# Patient Record
Sex: Female | Born: 1985 | Race: White | Hispanic: No | Marital: Married | Smoking: Never smoker
Health system: Southern US, Community
[De-identification: ages and names within clinical notes are randomized; demographics above are authoritative.]

## PROBLEM LIST (undated history)

## (undated) DIAGNOSIS — E663 Overweight: Secondary | ICD-10-CM

## (undated) DIAGNOSIS — T7840XA Allergy, unspecified, initial encounter: Secondary | ICD-10-CM

## (undated) DIAGNOSIS — L301 Dyshidrosis [pompholyx]: Secondary | ICD-10-CM

## (undated) DIAGNOSIS — Z789 Other specified health status: Secondary | ICD-10-CM

## (undated) HISTORY — DX: Overweight: E66.3

## (undated) HISTORY — DX: Dyshidrosis (pompholyx): L30.1

## (undated) HISTORY — PX: NO PAST SURGERIES: SHX2092

## (undated) HISTORY — DX: Allergy, unspecified, initial encounter: T78.40XA

## (undated) HISTORY — PX: WISDOM TOOTH EXTRACTION: SHX21

---

## 2018-07-23 DIAGNOSIS — U071 COVID-19: Secondary | ICD-10-CM

## 2018-07-23 HISTORY — DX: COVID-19: U07.1

## 2018-12-04 ENCOUNTER — Ambulatory Visit (INDEPENDENT_AMBULATORY_CARE_PROVIDER_SITE_OTHER): Payer: BC Managed Care – PPO | Admitting: Physician Assistant

## 2018-12-04 ENCOUNTER — Other Ambulatory Visit (HOSPITAL_COMMUNITY)
Admission: RE | Admit: 2018-12-04 | Discharge: 2018-12-04 | Disposition: A | Discharge status: Home / Self Care | Payer: BC Managed Care – PPO | Source: Ambulatory Visit | Attending: Physician Assistant | Admitting: Physician Assistant

## 2018-12-04 ENCOUNTER — Encounter: Payer: Self-pay | Admitting: Physician Assistant

## 2018-12-04 VITALS — BP 116/79 | HR 79 | Temp 98.0°F | Ht 65.0 in | Wt 181.0 lb

## 2018-12-04 DIAGNOSIS — Z1329 Encounter for screening for other suspected endocrine disorder: Secondary | ICD-10-CM | POA: Diagnosis not present

## 2018-12-04 DIAGNOSIS — Z131 Encounter for screening for diabetes mellitus: Secondary | ICD-10-CM

## 2018-12-04 DIAGNOSIS — Z3169 Encounter for other general counseling and advice on procreation: Secondary | ICD-10-CM | POA: Diagnosis not present

## 2018-12-04 DIAGNOSIS — Z0000 Encounter for general adult medical examination without abnormal findings: Secondary | ICD-10-CM | POA: Diagnosis not present

## 2018-12-04 DIAGNOSIS — Z124 Encounter for screening for malignant neoplasm of cervix: Secondary | ICD-10-CM | POA: Insufficient documentation

## 2018-12-04 DIAGNOSIS — Z130 Encounter for screening for diseases of the blood and blood-forming organs and certain disorders involving the immune mechanism: Secondary | ICD-10-CM

## 2018-12-04 DIAGNOSIS — Z13220 Encounter for screening for lipoid disorders: Secondary | ICD-10-CM

## 2018-12-04 DIAGNOSIS — L301 Dyshidrosis [pompholyx]: Secondary | ICD-10-CM

## 2018-12-04 NOTE — Patient Instructions (Signed)
Preparing for Pregnancy  If you are considering becoming pregnant, make an appointment to see your regular health care provider to learn how to prepare for a safe and healthy pregnancy (preconception care). During a preconception care visit, your health care provider will:  · Do a complete physical exam, including a Pap test.  · Take a complete medical history.  · Give you information, answer your questions, and help you resolve problems.  Preconception checklist  Medical history  · Tell your health care provider about any current or past medical conditions. Your pregnancy or your ability to become pregnant may be affected by chronic conditions, such as diabetes, chronic hypertension, and thyroid problems.  · Include your family's medical history as well as your partner's medical history.  · Tell your health care provider about any history of STIs (sexually transmitted infections). These can affect your pregnancy. In some cases, they can be passed to your baby. Discuss any concerns that you have about STIs.  · If indicated, discuss the benefits of genetic testing. This testing will show whether there are any genetic conditions that may be passed from you or your partner to your baby.  · Tell your health care provider about:  ? Any problems you have had with conception or pregnancy.  ? Any medicines you take. These include vitamins, herbal supplements, and over-the-counter medicines.  ? Your history of immunizations. Discuss any vaccinations that you may need.  Diet  · Ask your health care provider what to include in a healthy diet that has a balance of nutrients. This is especially important when you are pregnant or preparing to become pregnant.  · Ask your health care provider to help you reach a healthy weight before pregnancy.  ? If you are overweight, you may be at higher risk for certain complications, such as high blood pressure, diabetes, and preterm birth.  ? If you are underweight, you are more likely to  have a baby who has a low birth weight.  Lifestyle, work, and home  · Let your health care provider know:  ? About any lifestyle habits that you have, such as alcohol use, drug use, or smoking.  ? About recreational activities that may put you at risk during pregnancy, such as downhill skiing and certain exercise programs.  ? Tell your health care provider about any international travel, especially any travel to places with an active Zika virus outbreak.  ? About harmful substances that you may be exposed to at work or at home. These include chemicals, pesticides, radiation, or even litter boxes.  ? If you do not feel safe at home.  Mental health  · Tell your health care provider about:  ? Any history of mental health conditions, including feelings of depression, sadness, or anxiety.  ? Any medicines that you take for a mental health condition. These include herbs and supplements.  Home instructions to prepare for pregnancy  Lifestyle    · Eat a balanced diet. This includes fresh fruits and vegetables, whole grains, lean meats, low-fat dairy products, healthy fats, and foods that are high in fiber. Ask to meet with a nutritionist or registered dietitian for assistance with meal planning and goals.  · Get regular exercise. Try to be active for at least 30 minutes a day on most days of the week. Ask your health care provider which activities are safe during pregnancy.  · Do not use any products that contain nicotine or tobacco, such as cigarettes and e-cigarettes. If you need   help quitting, ask your health care provider.  · Do not drink alcohol.  · Do not take illegal drugs.  · Maintain a healthy weight. Ask your health care provider what weight range is right for you.  General instructions  · Keep an accurate record of your menstrual periods. This makes it easier for your health care provider to determine your baby's due date.  · Begin taking prenatal vitamins and folic acid supplements daily as directed by your  health care provider.  · Manage any chronic conditions, such as high blood pressure and diabetes, as told by your health care provider. This is important.  How do I know that I am pregnant?  You may be pregnant if you have been sexually active and you miss your period. Symptoms of early pregnancy include:  · Mild cramping.  · Very light vaginal bleeding (spotting).  · Feeling unusually tired.  · Nausea and vomiting (morning sickness).  If you have any of these symptoms and you suspect that you might be pregnant, you can take a home pregnancy test. These tests check for a hormone in your urine (human chorionic gonadotropin, or hCG). A woman's body begins to make this hormone during early pregnancy. These tests are very accurate. Wait until at least the first day after you miss your period to take one. If the test shows that you are pregnant (you get a positive result), call your health care provider to make an appointment for prenatal care.  What should I do if I become pregnant?         · Make an appointment with your health care provider as soon as you suspect you are pregnant.  · Do not use any products that contain nicotine, such as cigarettes, chewing tobacco, and e-cigarettes. If you need help quitting, ask your health care provider.  · Do not drink alcoholic beverages. Alcohol is related to a number of birth defects.  · Avoid toxic odors and chemicals.  · You may continue to have sexual intercourse if it does not cause pain or other problems, such as vaginal bleeding.  This information is not intended to replace advice given to you by your health care provider. Make sure you discuss any questions you have with your health care provider.  Document Released: 06/21/2008 Document Revised: 07/11/2017 Document Reviewed: 01/29/2016  Elsevier Interactive Patient Education © 2019 [employer]

## 2018-12-04 NOTE — Progress Notes (Signed)
HPI:                                                                Shelia Delgado is a 33 y.o. female who presents to [facility] Primary [facility] Medicine today to establish care  Current Concerns include: annual physical  Planning for pregnancy. Will complete current pillpack and begin trying at the end of this month. Currently taking prenatal  GYN/Sexual Health  Obstetrics: G0P0  Menstrual status: OCP  LMP: 11/10/18  Menses:  Last pap smear: 2017  History of abnormal pap smears: never  Sexually active: yes, 1 female partner (husband)  Current contraception: OCP  History of STI: never  Depression screen PHQ 2/9 12/04/2018  Decreased Interest 0  Down, Depressed, Hopeless 0  PHQ - 2 Score 0    Health Maintenance Health Maintenance  Topic Date Due  . HIV Screening  04/02/2002  . TETANUS/TDAP  04/02/2006  . PAP SMEAR-Modifier  04/02/2008  . INFLUENZA VACCINE  02/21/2019    Past Medical History:  Diagnosis Date  . Allergy   . Dyshidrotic dermatitis   . Overweight    Past Surgical History:  Procedure Laterality Date  . WISDOM TOOTH EXTRACTION     Social History   Tobacco Use  . Smoking status: Never Smoker  . Smokeless tobacco: Never Used  Substance Use Topics  . Alcohol use: Yes    Alcohol/week: 14.0 standard drinks    Types: 14 Standard drinks or equivalent per week   family history includes Atrial fibrillation in her father; Diabetes in her maternal grandfather; Heart attack in her paternal grandfather and paternal uncle; Hypertension in her father and mother; Skin cancer in her mother.  ROS: negative except as noted in the HPI  Medications: Current Outpatient Medications  Medication Sig Dispense Refill  . NORETHINDRONE ACET-ETHINYL EST PO Take by mouth.    . Prenatal w/o A Vit-Fe Fum-FA (PRENATA PO) Take by mouth.     No current facility-administered medications for this visit.    Allergies  Allergen Reactions  .  Amoxicillin Rash    Mild rash after taking for 1 week       Objective:  BP 116/79   Pulse 79   Temp 98 F (36.7 C) (Oral)   Ht  (1.651 m)   Wt 181 lb (82.1 kg)   LMP 11/10/2018   BMI 30.12 kg/m  General Appearance:  Alert, cooperative, no distress, appropriate for age, overweight female                            Head:  Normocephalic, without obvious abnormality                             Eyes:  PERRL, EOM's intact, conjunctiva and cornea clear                             Ears:  TM pearly gray color and semitransparent, external ear canals normal, both ears  Nose:  Nares symmetrical, mucosa pink                          Throat:  Lips, tongue, and mucosa are moist, pink, and intact; oropharynx clear, uvula midline; good dentition                             Neck:  Supple; symmetrical, trachea midline, no adenopathy; thyroid: no enlargement, symmetric, no tenderness/mass/nodules                             Back:  Symmetrical, no curvature, ROM normal               Chest/Breast:  deferred                           Lungs:  Clear to auscultation bilaterally, respirations unlabored                             Heart:  normal rate & regular rhythm, S1 and S2 normal, no murmurs, rubs, or gallops                     Abdomen:  Soft, non-tender, no mass or organomegaly              Genitourinary:  vulva without rashes or lesions, normal introitus and urethral meatus, vaginal mucosa without erythema, normal discharge, cervix slightly friable without lesions,          Musculoskeletal:  Tone and strength strong and symmetrical, all extremities; no joint pain or edema, normal gait and station                                   Lymphatic:  No adenopathy             Skin/Hair/Nails:  Skin warm, dry and intact, no rashes or abnormal dyspigmentation on limited exam                   Neurologic:  Alert and oriented x3, no cranial nerve deficits, DTR's intact, sensation  grossly intact, normal gait and station, no tremor Psych: well-groomed, cooperative, good eye contact, euthymic mood, affect mood-congruent, speech is articulate, and thought processes clear and goal-directed   A chaperone was present for the GU portion of the exam, Olivia MackieEvonia Henry, RMA.   No results found for this or any previous visit (from the past 72 hour(s)). No results found.    Assessment and Plan: 33 y.o. female with   .Jill Sidelison was seen today for establish care.  Diagnoses and all orders for this visit:  Encounter for Pap smear of cervix with HPV DNA cotesting -     Cytology - PAP  Encounter for annual physical exam -     CBC -     COMPLETE METABOLIC PANEL WITH GFR -     Lipid Panel w/reflex Direct LDL -     TSH + free T4  Encounter for preconception consultation -     CBC -     COMPLETE METABOLIC PANEL WITH GFR -     Lipid Panel w/reflex Direct LDL -     TSH + free T4 -  Ambulatory referral to Obstetrics / Gynecology  Screening for thyroid disorder -     TSH + free T4  Screening for blood disease -     CBC -     COMPLETE METABOLIC PANEL WITH GFR  Screening for diabetes mellitus -     COMPLETE METABOLIC PANEL WITH GFR  Screening for lipid disorders -     Lipid Panel w/reflex Direct LDL  Dyshidrotic dermatitis   - Personally reviewed PMH, PSH, PFH, medications, allergies, HM - Age-appropriate cancer screening: Pap smear pending - Tdap UTD - PHQ2 negative - Routine labs pending - Continue prenatal - Establish care with OB/GYN at 10 weekss   Patient education and anticipatory guidance given Patient agrees with treatment plan Follow-up based on Pap results or sooner as needed  Levonne Hubert PA-C

## 2018-12-05 LAB — COMPLETE METABOLIC PANEL WITH GFR
AG Ratio: 1.4 (calc) (ref 1.0–2.5)
ALT: 16 U/L (ref 6–29)
AST: 17 U/L (ref 10–30)
Albumin: 4.1 g/dL (ref 3.6–5.1)
Alkaline phosphatase (APISO): 54 U/L (ref 31–125)
BUN: 12 mg/dL (ref 7–25)
CO2: 23 mmol/L (ref 20–32)
Calcium: 9.1 mg/dL (ref 8.6–10.2)
Chloride: 105 mmol/L (ref 98–110)
Creat: 0.76 mg/dL (ref 0.50–1.10)
GFR, Est African American: 121 mL/min/{1.73_m2} (ref >=60)
GFR, Est Non African American: 105 mL/min/{1.73_m2} (ref >=60)
Globulin: 3 g/dL (calc) (ref 1.9–3.7)
Glucose, Bld: 79 mg/dL (ref 65–99)
Potassium: 4.2 mmol/L (ref 3.5–5.3)
Sodium: 138 mmol/L (ref 135–146)
Total Bilirubin: 0.4 mg/dL (ref 0.2–1.2)
Total Protein: 7.1 g/dL (ref 6.1–8.1)

## 2018-12-05 LAB — LIPID PANEL W/REFLEX DIRECT LDL
Cholesterol: 191 mg/dL (ref <200)
HDL: 72 mg/dL (ref >=50)
LDL Cholesterol (Calc): 90 mg/dL (calc)
Non-HDL Cholesterol (Calc): 119 mg/dL (calc) (ref <130)
Total CHOL/HDL Ratio: 2.7 (calc) (ref <5.0)
Triglycerides: 193 mg/dL — ABNORMAL HIGH (ref <150)

## 2018-12-05 LAB — CBC
HCT: 41 % (ref 35.0–45.0)
Hemoglobin: 13.7 g/dL (ref 11.7–15.5)
MCH: 29.8 pg (ref 27.0–33.0)
MCHC: 33.4 g/dL (ref 32.0–36.0)
MCV: 89.1 fL (ref 80.0–100.0)
MPV: 10.9 fL (ref 7.5–12.5)
Platelets: 356 10*3/uL (ref 140–400)
RBC: 4.6 10*6/uL (ref 3.80–5.10)
RDW: 13.1 % (ref 11.0–15.0)
WBC: 11 10*3/uL — ABNORMAL HIGH (ref 3.8–10.8)

## 2018-12-05 LAB — CYTOLOGY - PAP
Diagnosis: NEGATIVE
HPV: NOT DETECTED

## 2018-12-05 LAB — TSH+FREE T4: TSH W/REFLEX TO FT4: 3.36 mIU/L

## 2019-01-26 ENCOUNTER — Ambulatory Visit (INDEPENDENT_AMBULATORY_CARE_PROVIDER_SITE_OTHER): Payer: BC Managed Care – PPO | Admitting: Obstetrics & Gynecology

## 2019-01-26 ENCOUNTER — Other Ambulatory Visit: Payer: Self-pay

## 2019-01-26 ENCOUNTER — Encounter: Payer: Self-pay | Admitting: Obstetrics & Gynecology

## 2019-01-26 VITALS — BP 133/79 | HR 90 | Ht 64.0 in | Wt 194.0 lb

## 2019-01-26 DIAGNOSIS — Z6833 Body mass index (BMI) 33.0-33.9, adult: Secondary | ICD-10-CM

## 2019-01-26 DIAGNOSIS — Z6838 Body mass index (BMI) 38.0-38.9, adult: Secondary | ICD-10-CM | POA: Insufficient documentation

## 2019-01-26 DIAGNOSIS — Z3169 Encounter for other general counseling and advice on procreation: Secondary | ICD-10-CM | POA: Diagnosis not present

## 2019-01-26 NOTE — Progress Notes (Signed)
   Subjective:    Patient ID: Shelia Delgado, female    DOB: 05/30/1986, 33 y.o.   MRN: [id number]  HPI  33 year old female was referred for preconceptual counseling.  Patient stopped birth control after being on it since age 64.  She did have a menstrual cycle approximately 28 days after stopping her birth control.  Her husband to started PA school and they are trying but not with marked intention.  She is on prenatal vitamins.  She is not interested in preconceptual genetic testing at this time.  Review of Systems  Constitutional: Negative.   Respiratory: Negative.   Cardiovascular: Negative.   Gastrointestinal: Negative.   Genitourinary: Negative.  Negative for menstrual problem.       Objective:   Physical Exam Vitals signs reviewed.  Constitutional:      General: She is not in acute distress.    Appearance: She is well-developed.  HENT:     Head: Normocephalic and atraumatic.  Eyes:     Conjunctiva/sclera: Conjunctivae normal.  Cardiovascular:     Rate and Rhythm: Normal rate.  Pulmonary:     Effort: Pulmonary effort is normal.  Skin:    General: Skin is warm and dry.  Neurological:     Mental Status: She is alert and oriented to person, place, and time.    Assessment & Plan:  33 yo female referred from preconceptual counseling.  1.  Continue prenatal vitamins 2.  Recommended normal BMI prior to conception.  Pt has weight loss plan and is exercising more. 3.  If not pregnant after 6 months of timed intercourse, pt should return to office.    20 mins spent face to face with greater than 50% counseling.

## 2019-02-13 ENCOUNTER — Encounter: Payer: Self-pay | Admitting: Physician Assistant

## 2019-05-22 ENCOUNTER — Encounter: Payer: Self-pay | Admitting: Osteopathic Medicine

## 2019-05-22 ENCOUNTER — Other Ambulatory Visit: Payer: Self-pay

## 2019-05-22 ENCOUNTER — Ambulatory Visit (INDEPENDENT_AMBULATORY_CARE_PROVIDER_SITE_OTHER): Payer: 59 | Admitting: Osteopathic Medicine

## 2019-05-22 VITALS — BP 120/79 | HR 71 | Temp 97.8°F | Wt 203.0 lb

## 2019-05-22 DIAGNOSIS — R635 Abnormal weight gain: Secondary | ICD-10-CM | POA: Diagnosis not present

## 2019-05-22 NOTE — Progress Notes (Signed)
HPI: Shelia Delgado is a 33 y.o. female who  has a past medical history of Allergy, Dyshidrotic dermatitis, and Overweight.  she presents to [facility] today, 05/22/19,  for chief complaint of:  Weight   Gain of about 50 lbs over 3 years, has not really changed diet/exercise other than getting off of weight watchers after her wedding.  Is concerned about some stretch marks  Wt Readings from Last 3 Encounters:  05/22/19 203 lb 0.6 oz (92.1 kg)  01/26/19 194 lb (88 kg)  12/04/18 181 lb (82.1 kg)   BP Readings from Last 3 Encounters:  05/22/19 120/79  01/26/19 133/79  12/04/18 116/79      At today's visit 05/22/19 ... PMH, PSH, FH reviewed and updated as needed.  Current medication list and allergy/intolerance hx reviewed and updated as needed. (See remainder of HPI, ROS, Phys Exam below)   No results found.  No results found for this or any previous visit (from the past 72 hour(s)).        ASSESSMENT/PLAN: The encounter diagnosis was Abnormal weight gain.   I do not have a strong suspicion for Cushing's, given normal blood pressure, no facial plethora, proximal myalgia.  There are some stretch marks but none appear to be greater than 1 cm wide, they are a little on the red side but no bright red or dark purple.  Normal periods, no history of infertility though has never been pregnant.  I do not have a strong suspicion for female hormones being out of whack or hyperandrogenism.  If continued weight gain/infertility would strongly consider referral to reproductive endocrinology  Orders Placed This Encounter  Procedures  . COMPLETE METABOLIC PANEL WITH GFR  . TSH  . T4, free  . Hemoglobin A1c  . Cortisol-am, blood     No orders of the defined types were placed in this encounter.   Patient Instructions  Lab test for the usual suspects, particularly thyroid disorder, diabetes screening. I don't have a strong suspicion for  Cushing's, we can get a cortisol level just to check though! I don't have a strong suspicion for female hormones being out of whack, since periods are normal. I think ok to go ahead and try for pregnancy and can continue weight loss through pregnancy as long as no malnutrition / over-exercise!        Follow-up plan: Return if symptoms worsen or fail to improve.                                                 ################################################# ################################################# ################################################# #################################################    Current Meds  Medication Sig  . Prenatal w/o A Vit-Fe Fum-FA (PRENATA PO) Take by mouth.    Allergies  Allergen Reactions  . Amoxicillin Rash    Mild rash after taking for 1 week       Review of Systems:  Constitutional: No recent illness  HEENT: No  headache, no vision change  Cardiac: No  chest pain, No  pressure, No palpitations  Respiratory:  No  shortness of breath. No  Cough  Gastrointestinal: No  abdominal pain  Musculoskeletal: No new myalgia/arthralgia  Skin: No  Rash, +stretch marks on abdomen   Hem/Onc: No  easy bruising/bleeding, No  abnormal lumps/bumps  Neurologic: No  weakness, No  Dizziness  Psychiatric: No  concerns with  depression, No  concerns with anxiety  Exam:  BP 120/79 (BP Location: Left Arm, Patient Position: Sitting, Cuff Size: Normal)   Pulse 71   Temp 97.8 F (36.6 C) (Oral)   Wt 203 lb 0.6 oz (92.1 kg)   BMI 34.85 kg/m   Constitutional: VS see above. General Appearance: alert, well-developed, well-nourished, NAD  Eyes: Normal lids and conjunctive, non-icteric sclera  Ears, Nose, Mouth, Throat: MMM, Normal external inspection ears/nares/mouth/lips/gums.  Neck: No masses, trachea midline.   Respiratory: Normal respiratory effort. no wheeze, no rhonchi, no  rales  Cardiovascular: S1/S2 normal, no murmur, no rub/gallop auscultated. RRR.   Musculoskeletal: Gait normal. Symmetric and independent movement of all extremities  Neurological: Normal balance/coordination. No tremor.  Skin: warm, dry, intact.   Psychiatric: Normal judgment/insight. Normal mood and affect. Oriented x3.       Visit summary with medication list and pertinent instructions was printed for patient to review, patient was advised to alert [place] if any updates are needed. All questions at time of visit were answered - patient instructed to contact office with any additional concerns. ER/RTC precautions were reviewed with the patient and understanding verbalized.   Note: Total time spent 25 minutes, greater than 50% of the visit was spent face-to-face counseling and coordinating care for the following: The encounter diagnosis was Abnormal weight gain.Marland Kitchen  Please note: voice recognition software was used to produce this document, and typos may escape review. Please contact Dr. Sheppard Coil for any needed clarifications.    Follow up plan: Return if symptoms worsen or fail to improve.

## 2019-05-22 NOTE — Patient Instructions (Addendum)
Lab test for the usual suspects, particularly thyroid disorder, diabetes screening. I don't have a strong suspicion for Cushing's, we can get a cortisol level just to check though! I don't have a strong suspicion for female hormones being out of whack, since periods are normal. I think ok to go ahead and try for pregnancy and can continue weight loss through pregnancy as long as no malnutrition / over-exercise!

## 2019-05-25 LAB — COMPLETE METABOLIC PANEL WITH GFR
AG Ratio: 1.6 (calc) (ref 1.0–2.5)
ALT: 35 U/L — ABNORMAL HIGH (ref 6–29)
AST: 23 U/L (ref 10–30)
Albumin: 4.2 g/dL (ref 3.6–5.1)
Alkaline phosphatase (APISO): 84 U/L (ref 31–125)
BUN: 11 mg/dL (ref 7–25)
CO2: 23 mmol/L (ref 20–32)
Calcium: 9.3 mg/dL (ref 8.6–10.2)
Chloride: 106 mmol/L (ref 98–110)
Creat: 0.73 mg/dL (ref 0.50–1.10)
GFR, Est African American: 126 mL/min/{1.73_m2} (ref >=60)
GFR, Est Non African American: 109 mL/min/{1.73_m2} (ref >=60)
Globulin: 2.6 g/dL (calc) (ref 1.9–3.7)
Glucose, Bld: 84 mg/dL (ref 65–99)
Potassium: 4.2 mmol/L (ref 3.5–5.3)
Sodium: 138 mmol/L (ref 135–146)
Total Bilirubin: 0.4 mg/dL (ref 0.2–1.2)
Total Protein: 6.8 g/dL (ref 6.1–8.1)

## 2019-05-25 LAB — HEMOGLOBIN A1C
Hgb A1c MFr Bld: 5.1 % of total Hgb (ref <5.7)
Mean Plasma Glucose: 100 (calc)
eAG (mmol/L): 5.5 (calc)

## 2019-05-25 LAB — T4, FREE: Free T4: 0.8 ng/dL (ref 0.8–1.8)

## 2019-05-25 LAB — TSH: TSH: 3.1 mIU/L

## 2019-05-25 LAB — CORTISOL-AM, BLOOD: Cortisol - AM: 10.1 ug/dL

## 2019-08-04 ENCOUNTER — Ambulatory Visit (INDEPENDENT_AMBULATORY_CARE_PROVIDER_SITE_OTHER): Payer: 59 | Admitting: Osteopathic Medicine

## 2019-08-04 ENCOUNTER — Encounter: Payer: Self-pay | Admitting: Osteopathic Medicine

## 2019-08-04 ENCOUNTER — Other Ambulatory Visit: Payer: Self-pay

## 2019-08-04 VITALS — BP 135/91 | HR 90 | Temp 98.2°F | Wt 201.0 lb

## 2019-08-04 DIAGNOSIS — M2550 Pain in unspecified joint: Secondary | ICD-10-CM

## 2019-08-04 DIAGNOSIS — M791 Myalgia, unspecified site: Secondary | ICD-10-CM | POA: Diagnosis not present

## 2019-08-04 NOTE — Patient Instructions (Signed)
Will see what labs show!  See printed info for hip exercises Consider rheumatology referral if looking like RA Consider sports med consult here w/ Dr T is labs all normal

## 2019-08-04 NOTE — Progress Notes (Signed)
HPI: Shelia Delgado is a 34 y.o. female who  has a past medical history of Allergy, Dyshidrotic dermatitis, and Overweight.  she presents to [facility] today, 08/04/19,  for chief complaint of:  Weight follow-up Joint pain  08/04/19 today: joint pain for a few months, worse w/ exercise, worst in hips. Has done some stretching prior to working out. Hasn't really helped. Morning she notes some unsteadiness d/t ankle/foot pain. Associated w/ dry eyes in AM. Concerned about RA. Fasting today.   Last visit 05/22/2019: She had concern for Cushing's given weight gain of 2 pounds over the past 3 years, and stretch marks.  At that time, I let patient know I did not have a very strong suspicion for Cushing's given normal blood pressure, no facial plethora, no proximal myalgia, and stretch marks are present but are not really fitting the typical appearance of Cushing's in terms of with which was less than 1 cm, and color which was normal rather than bright red or dark purple. If continued weight gain/infertility would strongly consider referral to reproductive endocrinology   Wt Readings from Last 3 Encounters:  08/04/19 201 lb (91.2 kg)  05/22/19 203 lb 0.6 oz (92.1 kg)  01/26/19 194 lb (88 kg)        At today's visit 08/04/19 ... PMH, PSH, FH reviewed and updated as needed.  Current medication list and allergy/intolerance hx reviewed and updated as needed. (See remainder of HPI, ROS, Phys Exam below)   No results found.  No results found for this or any previous visit (from the past 72 hour(s)).  Results reviewed from labs at last visit 05/22/2019: Normal a.m. cortisol, TSH, A1c, CMP with the exception of minimally above normal liver enzymes ALT at 35 unlikely of clinical significance      ASSESSMENT/PLAN: The primary encounter diagnosis was Myalgia. A diagnosis of Arthralgia, unspecified joint was also pertinent to this visit.   Some hip  tenderness w/ resisted hip extension and w/ FADIR, no other phsyical exam findings   Orders Placed This Encounter  Procedures  . CK  . High sensitivity CRP  . Rheumatoid factor  . Sedimentation rate  . ANA  . Sjogren's syndrome antibods(ssa + ssb)  . COMPLETE METABOLIC PANEL WITH GFR     No orders of the defined types were placed in this encounter.   Patient Instructions  Will see what labs show!  See printed info for hip exercises Consider rheumatology referral if looking like RA Consider sports med consult here w/ Dr T is labs all normal     Follow-up plan: Return for RECHECK PENDING RESULTS / IF WORSE OR CHANGE.                                                 ################################################# ################################################# ################################################# #################################################    Current Meds  Medication Sig  . Prenatal w/o A Vit-Fe Fum-FA (PRENATA PO) Take by mouth.    Allergies  Allergen Reactions  . Amoxicillin Rash    Mild rash after taking for 1 week       Review of Systems:  Constitutional: No recent illness  HEENT: Dry eye, no dry mouth   Cardiac: No  chest pain, No  pressure, No palpitations  Respiratory:  No  shortness of breath. No  Cough  Gastrointestinal: No  abdominal pain,  no change on bowel habits  Musculoskeletal: +new myalgia/arthralgia  Skin: No  Rash   Exam:  BP (!) 135/91 (BP Location: Left Arm, Patient Position: Sitting, Cuff Size: Normal)   Pulse 90   Temp 98.2 F (36.8 C) (Oral)   Wt 201 lb (91.2 kg)   BMI 34.50 kg/m   Constitutional: VS see above. General Appearance: alert, well-developed, well-nourished, NAD  Respiratory: Normal respiratory effort.  Musculoskeletal: Gait normal. Symmetric and independent movement of all extremities, see A/P  Neurological: Normal balance/coordination. No  tremor.  Psychiatric: Normal judgment/insight. Normal mood and affect. Oriented x3.       Visit summary with medication list and pertinent instructions was printed for patient to review, patient was advised to alert [place] if any updates are needed. All questions at time of visit were answered - patient instructed to contact office with any additional concerns. ER/RTC precautions were reviewed with the patient and understanding verbalized.    Please note: voice recognition software was used to produce this document, and typos may escape review. Please contact Dr. Sheppard Coil for any needed clarifications.    Follow up plan: Return for RECHECK PENDING RESULTS / IF WORSE OR CHANGE.

## 2019-08-05 ENCOUNTER — Encounter: Payer: Self-pay | Admitting: Osteopathic Medicine

## 2019-08-06 LAB — ANA: Anti Nuclear Antibody (ANA): POSITIVE — AB

## 2019-08-06 LAB — CK: Total CK: 165 U/L — ABNORMAL HIGH (ref 29–143)

## 2019-08-06 LAB — COMPLETE METABOLIC PANEL WITH GFR
AG Ratio: 1.6 (calc) (ref 1.0–2.5)
ALT: 51 U/L — ABNORMAL HIGH (ref 6–29)
AST: 27 U/L (ref 10–30)
Albumin: 4.4 g/dL (ref 3.6–5.1)
Alkaline phosphatase (APISO): 80 U/L (ref 31–125)
BUN: 11 mg/dL (ref 7–25)
CO2: 24 mmol/L (ref 20–32)
Calcium: 9.7 mg/dL (ref 8.6–10.2)
Chloride: 105 mmol/L (ref 98–110)
Creat: 0.81 mg/dL (ref 0.50–1.10)
GFR, Est African American: 111 mL/min/{1.73_m2} (ref >=60)
GFR, Est Non African American: 96 mL/min/{1.73_m2} (ref >=60)
Globulin: 2.7 g/dL (calc) (ref 1.9–3.7)
Glucose, Bld: 81 mg/dL (ref 65–99)
Potassium: 4.1 mmol/L (ref 3.5–5.3)
Sodium: 138 mmol/L (ref 135–146)
Total Bilirubin: 0.6 mg/dL (ref 0.2–1.2)
Total Protein: 7.1 g/dL (ref 6.1–8.1)

## 2019-08-06 LAB — HIGH SENSITIVITY CRP: hs-CRP: 1.9 mg/L

## 2019-08-06 LAB — RHEUMATOID FACTOR: Rheumatoid fact SerPl-aCnc: <14 IU/mL (ref <14)

## 2019-08-06 LAB — ANTI-NUCLEAR AB-TITER (ANA TITER): ANA Titer 1: 1:40 {titer} — ABNORMAL HIGH

## 2019-08-06 LAB — SJOGREN'S SYNDROME ANTIBODS(SSA + SSB)
SSA (Ro) (ENA) Antibody, IgG: <1 AI
SSB (La) (ENA) Antibody, IgG: <1 AI

## 2019-08-06 LAB — SEDIMENTATION RATE: Sed Rate: 6 mm/h (ref 0–20)

## 2019-08-20 ENCOUNTER — Encounter: Payer: Self-pay | Admitting: Osteopathic Medicine

## 2019-08-20 DIAGNOSIS — R768 Other specified abnormal immunological findings in serum: Secondary | ICD-10-CM

## 2019-09-16 NOTE — Progress Notes (Signed)
Office Visit Note  Patient: Shelia Delgado             Date of Birth: 08-23-1985           MRN: [id number]             PCP: Emeterio Reeve, DO Referring: Emeterio Reeve, DO Visit Date: 09/18/2019 Occupation: '@GUAROCC' @  Subjective:  Positive ANA and hip pain.   History of Present Illness: Shelia Delgado is a 34 y.o. female seen in consultation per request of her PCP.  According to patient she she has been doing strength training exercises for the last few months.  She started exercising in November 2020 and gradually she started having pain in her bilateral hips.  She states the pain got worse over time to the point she is having difficulty standing and walking.  The pain gets worse when she is exercising.  Now she is doing only upper body exercises.  She states that she cleaned her house yesterday and the pain got worse.  None of the other joints are painful.  She has some stiffness in her feet that she has bilateral bunions.  There is no history of oral ulcers, nasal ulcers, malar rash, photosensitivity, Raynaud's phenomenon or joint swelling.  There is no family history of autoimmune disease.  There is history of osteoarthritis in her mother.  Activities of Daily Living:  Patient reports morning stiffness for 0 minutes.   Patient Denies nocturnal pain.  Difficulty dressing/grooming: Denies Difficulty climbing stairs: Reports Difficulty getting out of chair: Denies Difficulty using hands for taps, buttons, cutlery, and/or writing: Denies  Review of Systems  Constitutional: Negative for fatigue, night sweats, weight gain and weight loss.  HENT: Negative for mouth sores, trouble swallowing, trouble swallowing, mouth dryness and nose dryness.   Eyes: Positive for dryness. Negative for pain, redness and visual disturbance.  Respiratory: Negative for cough, shortness of breath and difficulty breathing.   Cardiovascular: Negative for chest pain, palpitations, hypertension,  irregular heartbeat and swelling in legs/feet.  Gastrointestinal: Negative for blood in stool, constipation and diarrhea.  Endocrine: Negative for increased urination.  Genitourinary: Negative for difficulty urinating, painful urination and vaginal dryness.  Musculoskeletal: Positive for arthralgias and joint pain. Negative for joint swelling, myalgias, muscle weakness, morning stiffness, muscle tenderness and myalgias.  Skin: Positive for rash. Negative for color change, hair loss, skin tightness, ulcers and sensitivity to sunlight.  Allergic/Immunologic: Negative for susceptible to infections.  Neurological: Positive for headaches. Negative for dizziness, numbness, memory loss, night sweats and weakness.  Hematological: Negative for bruising/bleeding tendency and swollen glands.  Psychiatric/Behavioral: Negative for depressed mood, confusion and sleep disturbance. The patient is not nervous/anxious.     PMFS History:  Patient Active Problem List   Diagnosis Date Noted  . BMI 33.0-33.9,adult 01/26/2019  . Encounter for preconception consultation 01/26/2019  . Dyshidrotic dermatitis     Past Medical History:  Diagnosis Date  . Allergy   . Dyshidrotic dermatitis   . Overweight     Family History  Problem Relation Age of Onset  . Hypertension Mother   . Skin cancer Mother   . Hypertension Father   . Atrial fibrillation Father   . Diabetes Maternal Grandfather   . Heart attack Paternal Grandfather   . Heart attack Paternal Uncle   . Healthy Brother    Past Surgical History:  Procedure Laterality Date  . WISDOM TOOTH EXTRACTION     Social History   Social History Narrative  . Not on  file   Immunization History  Administered Date(s) Administered  . Hepatitis B 04/06/1997, 05/16/1999, 10/19/1999  . Hpv 11/05/2005, 03/18/2007, 07/22/2007  . MMR 11/19/1988, 04/06/1997  . PPD Test 06/21/2018  . Tdap 11/05/2005, 05/21/2017  . Varicella 05/16/1999, 07/28/2008      Objective: Vital Signs: BP 124/82 (BP Location: Right Arm, Patient Position: Sitting, Cuff Size: Normal)   Pulse 71   Resp 14   Ht '5\' 4"'  (1.626 m)   Wt 201 lb (91.2 kg)   BMI 34.50 kg/m    Physical Exam Vitals and nursing note reviewed.  Constitutional:      Appearance: She is well-developed.  HENT:     Head: Normocephalic and atraumatic.  Eyes:     Conjunctiva/sclera: Conjunctivae normal.  Cardiovascular:     Rate and Rhythm: Normal rate and regular rhythm.     Heart sounds: Normal heart sounds.  Pulmonary:     Effort: Pulmonary effort is normal.     Breath sounds: Normal breath sounds.  Abdominal:     General: Bowel sounds are normal.     Palpations: Abdomen is soft.  Musculoskeletal:     Cervical back: Normal range of motion.  Lymphadenopathy:     Cervical: No cervical adenopathy.  Skin:    General: Skin is warm and dry.     Capillary Refill: Capillary refill takes less than 2 seconds.  Neurological:     Mental Status: She is alert and oriented to person, place, and time.  Psychiatric:        Behavior: Behavior normal.      Musculoskeletal Exam: C-spine thoracic and lumbar spine were in good range of motion.  She had no SI joint tenderness.  Shoulder joints, elbow joints, wrist joints, MCPs and PIPs with good range of motion with no synovitis.  Hip joints, knee joints, ankles, MTPs and PIPs with good range of motion with no synovitis.  She has bilateral hallux rigidus.  She had tenderness on palpation over bilateral ischial bursa and gluteal region.  CDAI Exam: CDAI Score: -- Patient Global: --; Provider Global: -- Swollen: --; Tender: -- Joint Exam 09/18/2019   No joint exam has been documented for this visit   There is currently no information documented on the homunculus. Go to the Rheumatology activity and complete the homunculus joint exam.  Investigation: No additional findings.  Imaging: No results found.  Recent Labs: Lab Results  Component  Value Date   WBC 11.0 (H) 12/04/2018   HGB 13.7 12/04/2018   PLT 356 12/04/2018   NA 138 08/04/2019   K 4.1 08/04/2019   CL 105 08/04/2019   CO2 24 08/04/2019   GLUCOSE 81 08/04/2019   BUN 11 08/04/2019   CREATININE 0.81 08/04/2019   BILITOT 0.6 08/04/2019   AST 27 08/04/2019   ALT 51 (H) 08/04/2019   PROT 7.1 08/04/2019   CALCIUM 9.7 08/04/2019   GFRAA 111 08/04/2019    Speciality Comments: No specialty comments available.  Procedures:  No procedures performed Allergies: Amoxicillin   Assessment / Plan:     Visit Diagnoses: Positive ANA (antinuclear antibody) - 08/04/19: ANA 1:40 NS, Ro-, La-, CK 165, CRP 1.9, RF-, ESR 6.  The ANA titer is very low and not significant.  She has no clinical features of autoimmune disease.  Chronic gluteal pain-she started experiencing gluteal pain after doing strenuous strength training.  She has some tenderness on palpation of the muscles.  Ischial bursitis, unspecified laterality-she has tenderness over bilateral ischial bursa.  I will refer her to physical therapy.  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  She did not have any rash on examination today.  Orders: Orders Placed This Encounter  Procedures  . Ambulatory referral to Physical Therapy   No orders of the defined types were placed in this encounter.   Face-to-face time spent with patient was 45 minutes. Greater than 50% of time was spent in counseling and coordination of care.  Follow-Up Instructions: Return in about 2 months (around 11/16/2019) for Positive ANA, ischial bursitis.   Bo Merino, MD  Note - This record has been created using Editor, commissioning.  Chart creation errors have been sought, but may not always  have been located. Such creation errors do not reflect on  the standard of medical care.

## 2019-09-18 ENCOUNTER — Ambulatory Visit (INDEPENDENT_AMBULATORY_CARE_PROVIDER_SITE_OTHER): Payer: 59 | Admitting: Rheumatology

## 2019-09-18 ENCOUNTER — Encounter: Payer: Self-pay | Admitting: Rheumatology

## 2019-09-18 ENCOUNTER — Other Ambulatory Visit: Payer: Self-pay

## 2019-09-18 VITALS — BP 124/82 | HR 71 | Resp 14 | Ht 64.0 in | Wt 201.0 lb

## 2019-09-18 DIAGNOSIS — M7918 Myalgia, other site: Secondary | ICD-10-CM | POA: Diagnosis not present

## 2019-09-18 DIAGNOSIS — L301 Dyshidrosis [pompholyx]: Secondary | ICD-10-CM

## 2019-09-18 DIAGNOSIS — R768 Other specified abnormal immunological findings in serum: Secondary | ICD-10-CM | POA: Diagnosis not present

## 2019-09-18 DIAGNOSIS — M7070 Other bursitis of hip, unspecified hip: Secondary | ICD-10-CM | POA: Diagnosis not present

## 2019-09-18 DIAGNOSIS — G8929 Other chronic pain: Secondary | ICD-10-CM

## 2019-10-26 ENCOUNTER — Ambulatory Visit: Payer: Self-pay | Admitting: Rheumatology

## 2019-11-19 ENCOUNTER — Ambulatory Visit: Payer: Self-pay | Admitting: Rheumatology

## 2019-12-04 ENCOUNTER — Encounter: Payer: Self-pay | Admitting: Osteopathic Medicine

## 2019-12-04 ENCOUNTER — Encounter: Payer: Self-pay | Admitting: Physician Assistant

## 2019-12-04 ENCOUNTER — Other Ambulatory Visit: Payer: Self-pay

## 2019-12-04 ENCOUNTER — Ambulatory Visit (INDEPENDENT_AMBULATORY_CARE_PROVIDER_SITE_OTHER): Payer: 59 | Admitting: Osteopathic Medicine

## 2019-12-04 VITALS — Wt 200.0 lb

## 2019-12-04 DIAGNOSIS — Z0000 Encounter for general adult medical examination without abnormal findings: Secondary | ICD-10-CM | POA: Diagnosis not present

## 2019-12-04 LAB — COMPLETE METABOLIC PANEL WITH GFR
AG Ratio: 1.7 (calc) (ref 1.0–2.5)
ALT: 52 U/L — ABNORMAL HIGH (ref 6–29)
AST: 20 U/L (ref 10–30)
Albumin: 4.4 g/dL (ref 3.6–5.1)
Alkaline phosphatase (APISO): 93 U/L (ref 31–125)
BUN: 11 mg/dL (ref 7–25)
CO2: 25 mmol/L (ref 20–32)
Calcium: 9.2 mg/dL (ref 8.6–10.2)
Chloride: 105 mmol/L (ref 98–110)
Creat: 0.75 mg/dL (ref 0.50–1.10)
GFR, Est African American: 122 mL/min/{1.73_m2} (ref >=60)
GFR, Est Non African American: 105 mL/min/{1.73_m2} (ref >=60)
Globulin: 2.6 g/dL (calc) (ref 1.9–3.7)
Glucose, Bld: 82 mg/dL (ref 65–99)
Potassium: 4 mmol/L (ref 3.5–5.3)
Sodium: 139 mmol/L (ref 135–146)
Total Bilirubin: 0.3 mg/dL (ref 0.2–1.2)
Total Protein: 7 g/dL (ref 6.1–8.1)

## 2019-12-04 LAB — CBC
HCT: 40.3 % (ref 35.0–45.0)
Hemoglobin: 13.5 g/dL (ref 11.7–15.5)
MCH: 30.5 pg (ref 27.0–33.0)
MCHC: 33.5 g/dL (ref 32.0–36.0)
MCV: 91 fL (ref 80.0–100.0)
MPV: 11 fL (ref 7.5–12.5)
Platelets: 291 10*3/uL (ref 140–400)
RBC: 4.43 10*6/uL (ref 3.80–5.10)
RDW: 12.5 % (ref 11.0–15.0)
WBC: 12.7 10*3/uL — ABNORMAL HIGH (ref 3.8–10.8)

## 2019-12-04 LAB — LIPID PANEL
Cholesterol: 170 mg/dL (ref <200)
HDL: 61 mg/dL (ref >=50)
LDL Cholesterol (Calc): 91 mg/dL (calc)
Non-HDL Cholesterol (Calc): 109 mg/dL (calc) (ref <130)
Total CHOL/HDL Ratio: 2.8 (calc) (ref <5.0)
Triglycerides: 86 mg/dL (ref <150)

## 2019-12-04 MED ORDER — PRENATA 29-1 MG PO CHEW: 1.0000 | CHEWABLE_TABLET | Freq: Every day | ORAL | 3 refills | Status: DC

## 2019-12-04 NOTE — Progress Notes (Signed)
Shelia Delgado is a 34 y.o. female who presents to  [facility] at [facility]  today, 12/04/19, seeking care for the following: . Annual physical - conducted virtually d/t recent coughing/COVID testing - I am disinclined to trust negative results in unvaccinated people, pt is ok to do virtual check-up.      ASSESSMENT & PLAN with other pertinent history/findings:  The encounter diagnosis was Annual physical exam.     Patient Instructions  General Preventive Care  Most recent routine screening labs: ordered today.   Everyone should have blood pressure checked once per year. Goal 130/80 or less.   Tobacco: don't! Alcohol: responsible moderation is ok for most adults - if you have concerns about your alcohol intake, please talk to me!   Exercise: as tolerated to reduce risk of cardiovascular disease and diabetes. Strength training will also prevent osteoporosis.   Mental health: if need fo mental health care (medicines, counseling, other), or concerns about moods, please let me know!   Sexual health: if need for STD testing, or if concerns with libido/pain problems, please let me know! If you need to discuss your birth control options, please let me know!   Advanced Directive: Living Will and/or Healthcare Power of Attorney recommended for all adults, regardless of age or health.  Vaccines  Flu vaccine: for almost everyone, every fall.   HPV vaccine: al done!  Shingles vaccine: after age 50.   Pneumonia vaccines: after age 65  Tetanus booster: every 10 years - due 2028  COVID vaccine: RECOMMENDED  Cancer screenings   Colon cancer screening: for everyone age 45-75.   Breast cancer screening: mammogram at age 40   Cervical cancer screening: Pap due 11/2023.   Lung cancer screening: not needed for non-smokers  Infection screenings  . HIV: recommended screening at least once age 18-65, more often as  needed. . Gonorrhea/Chlamydia: screening as needed . Hepatitis C: recommended once for everyone age 18-75 . TB: certain at-risk populations Other . Bone Density Test: recommended for women at age 65    Orders Placed This Encounter  Procedures  . CBC  . COMPLETE METABOLIC PANEL WITH GFR  . Lipid panel    Meds ordered this encounter  Medications  . Prenatal w/o A Vit-Fe Fum-FA (PRENATAL VITAMIN W/FE, FA) 29-1 MG CHEW    Sig: Chew 1 tablet by mouth daily.    Dispense:  90 tablet    Refill:  3     Immunization History  Administered Date(s) Administered  . Hepatitis B 04/06/1997, 05/16/1999, 10/19/1999  . Hpv 11/05/2005, 03/18/2007, 07/22/2007  . MMR 11/19/1988, 04/06/1997  . PPD Test 06/21/2018  . Tdap 11/05/2005, 05/21/2017  . Varicella 05/16/1999, 07/28/2008         Follow-up instructions: Return in about 1 year (around 12/03/2020) for ANNUAL (call week prior to visit for lab orders).                                         Wt 200 lb (90.7 kg)   LMP 11/30/2019   BMI 34.33 kg/m   Current Meds  Medication Sig  . [DISCONTINUED] Prenatal w/o A Vit-Fe Fum-FA (PRENATA PO) Take by mouth.    No results found for this or any previous visit (from the past 72 hour(s)).  No results found.  Depression screen PHQ 2/9 12/04/2019 05/22/2019 12/04/2018  Decreased Interest   0 0 0  Down, Depressed, Hopeless 0 0 0  PHQ - 2 Score 0 0 0  Altered sleeping 0 0 -  Tired, decreased energy 0 1 -  Change in appetite 0 0 -  Feeling bad or failure about yourself  0 1 -  Trouble concentrating 0 0 -  Moving slowly or fidgety/restless 0 0 -  Suicidal thoughts 0 0 -  PHQ-9 Score 0 2 -  Difficult doing work/chores - Not difficult at all -    GAD 7 : Generalized Anxiety Score 12/04/2019 05/22/2019  Nervous, Anxious, on Edge 0 0  Control/stop worrying 0 0  Worry too much - different things 0 0  Trouble relaxing 0 0  Restless 0 0  Easily annoyed  or irritable 0 0  Afraid - awful might happen 0 0  Total GAD 7 Score 0 0  Anxiety Difficulty - Not difficult at all      All questions at time of visit were answered - patient instructed to contact office with any additional concerns or updates.  ER/RTC precautions were reviewed with the patient.  Please note: voice recognition software was used to produce this document, and typos may escape review. Please contact Dr. Sheppard Coil for any needed clarifications.   Total encounter time: 30 minutes.

## 2019-12-04 NOTE — Patient Instructions (Signed)
General Preventive Care  Most recent routine screening labs: ordered today.   Everyone should have blood pressure checked once per year. Goal 130/80 or less.   Tobacco: don't! Alcohol: responsible moderation is ok for most adults - if you have concerns about your alcohol intake, please talk to me!   Exercise: as tolerated to reduce risk of cardiovascular disease and diabetes. Strength training will also prevent osteoporosis.   Mental health: if need fo mental health care (medicines, counseling, other), or concerns about moods, please let me know!   Sexual health: if need for STD testing, or if concerns with libido/pain problems, please let me know! If you need to discuss your birth control options, please let me know!   Advanced Directive: Living Will and/or Healthcare Power of Attorney recommended for all adults, regardless of age or health.  Vaccines  Flu vaccine: for almost everyone, every fall.   HPV vaccine: al done!  Shingles vaccine: after age 12.   Pneumonia vaccines: after age 76  Tetanus booster: every 10 years - due 2028  COVID vaccine: RECOMMENDED  Cancer screenings   Colon cancer screening: for everyone age 83-75.   Breast cancer screening: mammogram at age 58   Cervical cancer screening: Pap due 11/2023.   Lung cancer screening: not needed for non-smokers  Infection screenings  . HIV: recommended screening at least once age 25-65, more often as needed. . Gonorrhea/Chlamydia: screening as needed . Hepatitis C: recommended once for everyone age 9-75 . TB: certain at-risk populations Other . Bone Density Test: recommended for women at age 73

## 2020-05-10 ENCOUNTER — Ambulatory Visit (INDEPENDENT_AMBULATORY_CARE_PROVIDER_SITE_OTHER): Payer: 59 | Admitting: Osteopathic Medicine

## 2020-05-10 ENCOUNTER — Encounter: Payer: Self-pay | Admitting: Osteopathic Medicine

## 2020-05-10 VITALS — BP 125/71 | HR 93 | Temp 98.1°F | Wt 198.9 lb

## 2020-05-10 DIAGNOSIS — K582 Mixed irritable bowel syndrome: Secondary | ICD-10-CM

## 2020-05-10 DIAGNOSIS — R7989 Other specified abnormal findings of blood chemistry: Secondary | ICD-10-CM | POA: Diagnosis not present

## 2020-05-10 MED ORDER — ONDANSETRON 8 MG PO TBDP: 8.0000 mg | ORAL_TABLET | Freq: Three times a day (TID) | ORAL | 3 refills | Status: DC | PRN

## 2020-05-10 MED ORDER — DICYCLOMINE HCL 20 MG PO TABS: 20.0000 mg | ORAL_TABLET | Freq: Four times a day (QID) | ORAL | 2 refills | Status: DC | PRN

## 2020-05-10 NOTE — Progress Notes (Signed)
Shelia Delgado is a 34 y.o. female who presents to  [facility] at [facility]  today, 05/10/20, seeking care for the following:   Abdominal pain and bowel movement changes - alternative between gas/bloating and diarrhea and constipation. This is a new problem since recovering from COVID. She had a panel performed that tested for food sensitivity, has some questions about possible elimination diet. Labs also tested TSH, testosterone. 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 A diagnosis of Low testosterone was also pertinent to this visit.   No results found for this or any previous visit (from the past 24 hour(s)).   Patient Instructions   PLAN: Elimination diet as below  Will get testosterone blood draw  Meds for symptoms: nausea (Zofran) and spasms (Bentyl)   2017 UpToDate Characteristics and sources of common FODMAPs  Word that corresponds to letter in acronym Compounds in this category Foods that contain these compounds  F Fermentable  O Oligosaccharides Fructans, galacto-oligosaccharides Wheat, barley, rye, onion, leek, white part of spring onion, garlic, shallots, artichokes, beetroot, fennel, peas, chicory, pistachio, cashews, legumes, lentils, and chickpeas  D Disaccharides Lactose Milk, custard, ice cream, and yogurt  M Monosaccharides "Free fructose" (fructose in excess of glucose) Apples, pears, mangoes, cherries, watermelon, asparagus, sugar snap peas, honey, high-fructose corn syrup  A And  P Polyols Sorbitol, mannitol, maltitol, and xylitol Apples, pears, apricots, cherries, nectarines, peaches, plums, watermelon, mushrooms, cauliflower, artificially sweetened chewing  gum and confectionery  FODMAPs: fermentable oligosaccharides, disaccharides, monosaccharides, and polyols. Adapted by permission from [employer]: [employer]. Lonell Face, [employer], [place]. Short-chain carbohydrates and functional gastrointestinal disorders. Am J Gastroenterol 2013; 108:707. Copyright  2013. www.nature.com/ajg. Graphic 44315 Version 2.0        Orders Placed This Encounter  Procedures   CP Testosterone, BIO-Female/Children    Meds ordered this encounter  Medications   ondansetron (ZOFRAN-ODT) 8 MG disintegrating tablet    Sig: Take 1 tablet (8 mg total) by mouth every 8 (eight) hours as needed for nausea.    Dispense:  20 tablet    Refill:  3   dicyclomine (BENTYL) 20 MG tablet    Sig: Take 1 tablet (20 mg total) by mouth every 6 (six) hours as needed (abdominal spasmp/cramping).    Dispense:  60 tablet    Refill:  2       Follow-up instructions: Return in about 6 weeks (around 06/21/2020) for if needed to recheck GI symptoms.                                         BP 125/71 (BP Location: Left Arm, Patient Position: Sitting, Cuff Size: Normal)    Pulse 93    Temp 98.1 F (36.7 C)    Wt 198 lb 14.4 oz (90.2 kg)    SpO2 100%    BMI 34.14 kg/m   Current Meds  Medication Sig   Prenatal w/o A Vit-Fe Fum-FA (PRENATAL VITAMIN W/FE, FA) 29-1 MG CHEW Chew 1 tablet by mouth daily.    No results found for this or any previous visit (from  the past 72 hour(s)).  No results found.     All questions at time of visit were answered - patient instructed to contact office with any additional concerns or updates.  ER/RTC precautions were reviewed with the patient as applicable.   Please note: voice recognition software was used to produce this document, and typos may escape review. Please contact Dr. Lyn Hollingshead for any needed clarifications.

## 2020-05-10 NOTE — Patient Instructions (Addendum)
PLAN: Elimination diet as below  Will get testosterone blood draw  Meds for symptoms: nausea (Zofran) and spasms (Bentyl)   2017 UpToDate Characteristics and sources of common FODMAPs  Word that corresponds to letter in acronym Compounds in this category Foods that contain these compounds  F Fermentable  O Oligosaccharides Fructans, galacto-oligosaccharides Wheat, barley, rye, onion, leek, white part of spring onion, garlic, shallots, artichokes, beetroot, fennel, peas, chicory, pistachio, cashews, legumes, lentils, and chickpeas  D Disaccharides Lactose Milk, custard, ice cream, and yogurt  M Monosaccharides "Free fructose" (fructose in excess of glucose) Apples, pears, mangoes, cherries, watermelon, asparagus, sugar snap peas, honey, high-fructose corn syrup  A And  P Polyols Sorbitol, mannitol, maltitol, and xylitol Apples, pears, apricots, cherries, nectarines, peaches, plums, watermelon, mushrooms, cauliflower, artificially sweetened chewing gum and confectionery  FODMAPs: fermentable oligosaccharides, disaccharides, monosaccharides, and polyols. Adapted by permission from [employer]: [employer]. Lonell Face, [employer], [place]. Short-chain carbohydrates and functional gastrointestinal disorders. Am J Gastroenterol 2013; 108:707. Copyright  2013. www.nature.com/ajg. Graphic 23536 Version 2.0

## 2020-05-12 LAB — CP TESTOSTERONE, BIO-FEMALE/CHILDREN
Albumin: 4.7 g/dL (ref 3.6–5.1)
Sex Hormone Binding: 32.4 nmol/L (ref 17–124)
TESTOSTERONE, BIOAVAILABLE: 7.9 ng/dL (ref 0.5–8.5)
Testosterone, Free: 3.7 pg/mL (ref 0.2–5.0)
Testosterone, Total, LC-MS-MS: 32 ng/dL (ref 2–45)

## 2020-05-13 ENCOUNTER — Encounter: Payer: Self-pay | Admitting: Osteopathic Medicine

## 2020-06-22 ENCOUNTER — Ambulatory Visit (INDEPENDENT_AMBULATORY_CARE_PROVIDER_SITE_OTHER): Payer: 59 | Admitting: Osteopathic Medicine

## 2020-06-22 ENCOUNTER — Other Ambulatory Visit: Payer: Self-pay

## 2020-06-22 ENCOUNTER — Encounter: Payer: Self-pay | Admitting: Osteopathic Medicine

## 2020-06-22 VITALS — BP 113/54 | HR 98 | Temp 98.6°F | Wt 194.1 lb

## 2020-06-22 DIAGNOSIS — N926 Irregular menstruation, unspecified: Secondary | ICD-10-CM | POA: Diagnosis not present

## 2020-06-22 LAB — POCT URINE PREGNANCY: Preg Test, Ur: POSITIVE — AB

## 2020-06-22 NOTE — Progress Notes (Signed)
Shelia Delgado is a 34 y.o. female who presents to  [facility] at [facility]  today, 06/22/20, seeking care for the following:  . Confirm pregnancy . F/u IBS type symptoms, elimination diet as discussed last viist has been helpful      ASSESSMENT & PLAN with other pertinent findings:  The encounter diagnosis was Missed period.   Results for orders placed or performed in visit on 06/22/20 (from the past 24 hour(s))  POCT urine pregnancy     Status: Abnormal   Collection Time: 06/22/20  3:56 PM  Result Value Ref Range   Preg Test, Ur Positive (A) Negative   OK to conitnue Zofran Limit Bentyl Taking PNV w/ iron and folic acid  Set up w/ OBGYN  No LMP recorded.  Patient Instructions  DUE DATE 02/21/21!     Orders Placed This Encounter  Procedures  . POCT urine pregnancy    No orders of the defined types were placed in this encounter.      Follow-up instructions: Return for recheck as needed .                                         BP (!) 113/54 (BP Location: Left Arm, Patient Position: Sitting, Cuff Size: Normal)   Pulse 98   Temp 98.6 F (37 C) (Oral)   Wt 194 lb 1.9 oz (88.1 kg)   BMI 33.32 kg/m   Current Meds  Medication Sig  . dicyclomine (BENTYL) 20 MG tablet Take 1 tablet (20 mg total) by mouth every 6 (six) hours as needed (abdominal spasmp/cramping).  . ondansetron (ZOFRAN-ODT) 8 MG disintegrating tablet Take 1 tablet (8 mg total) by mouth every 8 (eight) hours as needed for nausea.  . Prenatal w/o A Vit-Fe Fum-FA (PRENATAL VITAMIN W/FE, FA) 29-1 MG CHEW Chew 1 tablet by mouth daily.    Results for orders placed or performed in visit on 06/22/20 (from the past 72 hour(s))  POCT urine pregnancy     Status: Abnormal   Collection Time: 06/22/20  3:56 PM  Result Value Ref Range   Preg Test, Ur Positive (A) Negative    No results found.     All questions at  time of visit were answered - patient instructed to contact office with any additional concerns or updates.  ER/RTC precautions were reviewed with the patient as applicable.   Please note: voice recognition software was used to produce this document, and typos may escape review. Please contact Dr. Lyn Hollingshead for any needed clarifications.

## 2020-06-22 NOTE — Patient Instructions (Signed)
DUE DATE 02/21/21!

## 2020-07-04 ENCOUNTER — Encounter: Payer: Self-pay | Admitting: *Deleted

## 2020-07-04 ENCOUNTER — Ambulatory Visit (INDEPENDENT_AMBULATORY_CARE_PROVIDER_SITE_OTHER): Payer: 59 | Admitting: *Deleted

## 2020-07-04 ENCOUNTER — Other Ambulatory Visit: Payer: Self-pay

## 2020-07-04 VITALS — Resp 16 | Ht 64.0 in

## 2020-07-04 DIAGNOSIS — Z3A01 Less than 8 weeks gestation of pregnancy: Secondary | ICD-10-CM | POA: Diagnosis not present

## 2020-07-04 DIAGNOSIS — O26859 Spotting complicating pregnancy, unspecified trimester: Secondary | ICD-10-CM

## 2020-07-04 NOTE — Progress Notes (Signed)
Pt here for early OB for spotting.  She started having pink and brown spotting last Thursday.  She denies any cramping or back pain.  Bedside U/S today shows viable IUP with FHT of 122 BPM and CRL measures 8.77mm  GA [redacted]w[redacted]d.  Pt does have a small subchorionic hemorrhage noted.  She is instructed to avoid intercourse and heavy lifting for the time being.  She is [facility] if the bleeding becomes red and like a period.  She also states that she has random nausea throughout the day and would like something for that.  Per Dr Penne Lash she may have Phenergan 25 mg tabs called to her pharmacy.  Pt will f/u 08/01/20 for her NOB

## 2020-07-07 ENCOUNTER — Other Ambulatory Visit: Payer: Self-pay | Admitting: *Deleted

## 2020-07-07 MED ORDER — PROMETHAZINE HCL 25 MG PO TABS: 25.0000 mg | ORAL_TABLET | Freq: Four times a day (QID) | ORAL | 1 refills | Status: DC | PRN

## 2020-07-11 ENCOUNTER — Telehealth: Payer: Self-pay

## 2020-07-11 NOTE — Telephone Encounter (Signed)
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 Needs documentation stating she is physically and mentally capable to complete work duties. Pt will bring in the form tomorrow for provider.

## 2020-07-11 NOTE — Telephone Encounter (Signed)
Noted, this shoul dbe ok to complete w/o a visit will see

## 2020-07-15 ENCOUNTER — Encounter: Payer: Self-pay | Admitting: Osteopathic Medicine

## 2020-07-19 ENCOUNTER — Other Ambulatory Visit: Payer: Self-pay

## 2020-07-19 DIAGNOSIS — O26859 Spotting complicating pregnancy, unspecified trimester: Secondary | ICD-10-CM

## 2020-07-19 NOTE — Progress Notes (Signed)
U/S order placed per Dr.Leggett 

## 2020-07-21 ENCOUNTER — Other Ambulatory Visit: Payer: Self-pay

## 2020-07-21 ENCOUNTER — Telehealth (INDEPENDENT_AMBULATORY_CARE_PROVIDER_SITE_OTHER): Payer: 59 | Admitting: Obstetrics & Gynecology

## 2020-07-21 ENCOUNTER — Ambulatory Visit (INDEPENDENT_AMBULATORY_CARE_PROVIDER_SITE_OTHER): Payer: 59

## 2020-07-21 ENCOUNTER — Encounter: Payer: Self-pay | Admitting: Obstetrics & Gynecology

## 2020-07-21 DIAGNOSIS — O26851 Spotting complicating pregnancy, first trimester: Secondary | ICD-10-CM

## 2020-07-21 DIAGNOSIS — O26859 Spotting complicating pregnancy, unspecified trimester: Secondary | ICD-10-CM

## 2020-07-21 DIAGNOSIS — Z3A09 9 weeks gestation of pregnancy: Secondary | ICD-10-CM

## 2020-07-21 DIAGNOSIS — O468X1 Other antepartum hemorrhage, first trimester: Secondary | ICD-10-CM

## 2020-07-21 DIAGNOSIS — O418X10 Other specified disorders of amniotic fluid and membranes, first trimester, not applicable or unspecified: Secondary | ICD-10-CM

## 2020-07-21 NOTE — Progress Notes (Signed)
   I connected with Shelia Delgado 07/21/20 at  3:15 PM EST by: MyChart video and verified that I am speaking with the correct person using two identifiers.  Patient is located at home and provider is located at [place].     The purpose of this virtual visit is to provide medical care while limiting exposure to the novel coronavirus. I discussed the limitations, risks, security and privacy concerns of performing an evaluation and management service by MyChart video and the availability of in person appointments. I also discussed with the patient that there may be a patient responsible charge related to this service. By engaging in this virtual visit, you consent to the provision of healthcare.  Additionally, you authorize for your insurance to be billed for the services provided during this visit.  The patient expressed understanding and agreed to proceed.      Subjective:  Shelia Delgado is a 34 y.o. G0P0000 at Unknown  for phone visit for visit after first trimester [place] follow up for subchorionic hemorrhage.  Pt having some spotting.  Doesn't want to wear a pad.   The following portions of the patient's history were reviewed and updated as appropriate: allergies, current medications, past family history, past medical history, past social history, past surgical history and problem list.   Objective:  There were no vitals filed for this visit.   Assessment and Plan:  Pregnancy: G1P0 with reassuring [place] with small subchorionic hemorrhage.  9 weeks 2 days. 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 PNV Simethicone and Tums for indigestion symptoms.   Future Appointments  Date Time Provider Department Center  08/01/2020  1:30 PM Lesly Dukes, MD [facility]  12/05/2020  1:20 PM Sunnie Nielsen, DO [facility] None     Time spent on virtual visit: 23 minutes--review of records, visit and documentation.  Elsie Lincoln, MD

## 2020-07-23 NOTE — L&D Delivery Note (Signed)
Operative Delivery Note At 2:31 PM a viable female was delivered via Vaginal, Vacuum Investment banker, operational).  Presentation: vertex; Position: Left,, Occiput,, Posterior; Station: +2.   Indication for operative vaginal delivery: NRFHT with maternal exhaustion.  Risks of vacuum assistance were discussed in detail, including but not limited to, bleeding, infection, damage to maternal tissues, fetal cephalohematoma, inability to effect vaginal delivery of the head or shoulder dystocia that cannot be resolved by established maneuvers and need for emergency cesarean section.  Patient gave verbal consent.  Patient was examined and found to be fully dilated with fetal station of +2. Bladder was drained with in-out catheter. The soft kiwi vacuum cup was positioned over the sagittal suture 3 cm anterior to posterior fontanelle.  Pressure was then increased to 500 mmHg, and the patient was instructed to push.  Pulling was administered along the pelvic curve.  3 pulls were administered during 3 pushes with great maternal effort, no popoffs.  The infant was then delivered atraumatically, with loose nuchal cord reduced at perineum. Infant noted to be a viable female infant, Apgars of 8 and 9..  There was spontaneous placental delivery, intact with three-vessel cord.  Intact perineum. No anesthesia for delivery, 20cc 1% lidocaine without epi used for laceration repair.  Sponge, instrument and needle counts were correct x2.  The patient and baby were stable after delivery and remained in couplet care, Neonatology present for delivery.    APGAR: 8, 9; weight pending skin to skin for 1 hour.   Placenta status: 3VC, intact, meconium stained, sent to L&D.   Cord: 3 vessel with the following complications: nuchal x1 .     Anesthesia:  None, local for repair Instruments: sponge and instrument count x2 correct. Episiotomy: None Lacerations: Left sulcus with 2nd degree perineal. Suture Repair: 3-0 Monocryl and 3-0  Vicryl. Quantitative Blood Loss (mL):  661 mL  Mom to postpartum.  Baby to Couplet care / Skin to Skin.  Jen Mow, DO 02/21/2021, 3:16 PM

## 2020-07-27 ENCOUNTER — Encounter: Payer: Self-pay | Admitting: *Deleted

## 2020-08-01 ENCOUNTER — Ambulatory Visit: Payer: 59

## 2020-08-01 ENCOUNTER — Other Ambulatory Visit: Payer: Self-pay

## 2020-08-01 ENCOUNTER — Ambulatory Visit (INDEPENDENT_AMBULATORY_CARE_PROVIDER_SITE_OTHER): Payer: 59 | Admitting: Obstetrics & Gynecology

## 2020-08-01 ENCOUNTER — Encounter: Payer: Self-pay | Admitting: Obstetrics & Gynecology

## 2020-08-01 ENCOUNTER — Other Ambulatory Visit (HOSPITAL_COMMUNITY)
Admission: RE | Admit: 2020-08-01 | Discharge: 2020-08-01 | Disposition: A | Discharge status: Home / Self Care | Payer: 59 | Source: Ambulatory Visit | Attending: Obstetrics & Gynecology | Admitting: Obstetrics & Gynecology

## 2020-08-01 VITALS — BP 108/67 | HR 94 | Wt 177.0 lb

## 2020-08-01 DIAGNOSIS — O418X10 Other specified disorders of amniotic fluid and membranes, first trimester, not applicable or unspecified: Secondary | ICD-10-CM

## 2020-08-01 DIAGNOSIS — O26859 Spotting complicating pregnancy, unspecified trimester: Secondary | ICD-10-CM

## 2020-08-01 DIAGNOSIS — Z3491 Encounter for supervision of normal pregnancy, unspecified, first trimester: Secondary | ICD-10-CM | POA: Diagnosis present

## 2020-08-01 DIAGNOSIS — O468X1 Other antepartum hemorrhage, first trimester: Secondary | ICD-10-CM

## 2020-08-01 DIAGNOSIS — Z3400 Encounter for supervision of normal first pregnancy, unspecified trimester: Secondary | ICD-10-CM

## 2020-08-01 MED ORDER — ASPIRIN 81 MG PO CHEW: 81.0000 mg | CHEWABLE_TABLET | Freq: Every day | ORAL | Status: DC

## 2020-08-01 NOTE — Progress Notes (Signed)
Bedside U/S shows single IUP with FHT of 175 BPM and CRL measuring 44.32mm  GA [redacted]w[redacted]d.  Small anterior Subchorionic hemorrhage noted.

## 2020-08-01 NOTE — Progress Notes (Signed)
Subjective:    [place] Huberty is a G1P0000 [redacted]w[redacted]d being seen today for her first obstetrical visit.  Her obstetrical history is significant for bleeding and small subchorionic hemorrhage in 1st trimester,  obesity.  Patient does intend to breast feed. Pregnancy history fully reviewed.  Patient reports fatigue, vaginal bleeding (subchor hemorrhage), nausea, vomiting x4   HISTORY: OB History  Gravida Para Term Preterm AB Living  1 0 0 0 0 0  SAB IAB Ectopic Multiple Live Births  0 0 0 0 0    # Outcome Date GA Lbr Len/2nd Weight Sex Delivery Anes PTL Lv  1 Current            Past Medical History:  Diagnosis Date  . Allergy   . Dyshidrotic dermatitis   . Overweight    Past Surgical History:  Procedure Laterality Date  . WISDOM TOOTH EXTRACTION     Family History  Problem Relation Age of Onset  . Hypertension Mother   . Skin cancer Mother   . Hypertension Father   . Atrial fibrillation Father   . Diabetes Maternal Grandfather   . Lung disease Maternal Grandfather   . Heart attack Paternal Grandfather   . Heart attack Paternal Uncle   . Healthy Brother      Exam    Uterus:     Pelvic Exam:    Perineum: No Hemorrhoids   Vulva: normal   Vagina:  normal mucosa, normal discharge   pH: n/a   Cervix: large bleeding ectropian   Adnexa: not evaluated   Bony Pelvis: average  System: Breast:  normal appearance, no masses or tenderness   Skin: normal coloration and turgor, no rashes    Neurologic: oriented, normal mood   Extremities: no deformities   HEENT sclera clear, anicteric, neck supple with midline trachea, thyroid without masses and trachea midline   Mouth/Teeth mucous membranes moist, pharynx normal without lesions   Neck supple and no masses   Cardiovascular: regular rate and rhythm   Respiratory:  appears well, vitals normal, no respiratory distress, acyanotic, normal RR, chest clear, no wheezing, crepitations, rhonchi, normal symmetric air entry   Abdomen:  soft, non-tender; bowel sounds normal; no masses,  no organomegaly   Urinary: urethral meatus normal      Assessment:    Pregnancy: G1P0000 Patient Active Problem List   Diagnosis Date Noted  . BMI 33.0-33.9,adult 01/26/2019  . Encounter for preconception consultation 01/26/2019  . Dyshidrotic dermatitis         Plan:     Initial labs drawn. Prenatal vitamins. Problem list reviewed and updated. Genetic Screening discussed and wants NIPS and AFP Anatomy US at 18-20 weeks Gas-x and colace for gas and constipation; Miralax prn Pt to try Zofran for nausea.  Diclegis is making her lethargic.  Pt meets criteria for ASA and early glucola.  Discussed today.    Indications for ASA therapy (per uptodate) One of the following: Previous pregnancy with preeclampsia, especially early onset and with an adverse outcome No Multifetal gestation No Chronic hypertension No Type 1 or 2 diabetes mellitus No Chronic kidney disease No Autoimmune disease (antiphospholipid syndrome, systemic lupus erythematosus) No   Two or more of the following: Nulliparity Yes Obesity (body mass index >30 kg/m2) Yes Family history of preeclampsia in mother or sister No Age ?35 years No Sociodemographic characteristics (African American race, low socioeconomic level) No Personal risk factors (eg, previous pregnancy with low birth weight or small for gestational age infant, previous adverse pregnancy  outcome [eg, stillbirth], interval >10 years between pregnancies) No  Indications for early 1 hour GTT (per uptodate)  BMI >25 (>23 in Asian women) AND one of the following  Gestational diabetes mellitus in a previous pregnancy No Glycated hemoglobin ?5.7 percent (39 mmol/mol), impaired glucose tolerance, or impaired fasting glucose on previous testing No First-degree relative with diabetes Yes High-risk race/ethnicity (eg, African American, Latino, Native American, [place] American, Pacific Islander)  No History of cardiovascular disease No Hypertension or on therapy for hypertension No High-density lipoprotein cholesterol level <35 mg/dL (1.28 mmol/L) and/or a triglyceride level >250 mg/dL (1.18 mmol/L) No Polycystic ovary syndrome No Physical inactivity No Other clinical condition associated with insulin resistance (eg, severe obesity, acanthosis nigricans) No Previous birth of an infant weighing ?4000 g No Previous stillbirth of unknown cause No  Shelia Delgado 08/01/2020

## 2020-08-02 LAB — OBSTETRIC PANEL
Absolute Monocytes: 714 cells/uL (ref 200–950)
Antibody Screen: NOT DETECTED
Basophils Absolute: 43 cells/uL (ref 0–200)
Basophils Relative: 0.5 %
Eosinophils Absolute: 120 cells/uL (ref 15–500)
Eosinophils Relative: 1.4 %
HCT: 35.4 % (ref 35.0–45.0)
Hemoglobin: 12 g/dL (ref 11.7–15.5)
Hepatitis B Surface Ag: NONREACTIVE
Lymphs Abs: 1763 cells/uL (ref 850–3900)
MCH: 29.3 pg (ref 27.0–33.0)
MCHC: 33.9 g/dL (ref 32.0–36.0)
MCV: 86.6 fL (ref 80.0–100.0)
MPV: 10.6 fL (ref 7.5–12.5)
Monocytes Relative: 8.3 %
Neutro Abs: 5960 cells/uL (ref 1500–7800)
Neutrophils Relative %: 69.3 %
Platelets: 406 10*3/uL — ABNORMAL HIGH (ref 140–400)
RBC: 4.09 10*6/uL (ref 3.80–5.10)
RDW: 12.8 % (ref 11.0–15.0)
RPR Ser Ql: NONREACTIVE
Rubella: 1.22 Index
Total Lymphocyte: 20.5 %
WBC: 8.6 10*3/uL (ref 3.8–10.8)

## 2020-08-02 LAB — CERVICOVAGINAL ANCILLARY ONLY
Chlamydia: NEGATIVE
Comment: NEGATIVE
Comment: NORMAL
Neisseria Gonorrhea: NEGATIVE

## 2020-08-02 LAB — HEPATITIS C ANTIBODY
Hepatitis C Ab: NONREACTIVE
SIGNAL TO CUT-OFF: 0.01 (ref <1.00)

## 2020-08-02 LAB — HIV ANTIBODY (ROUTINE TESTING W REFLEX): HIV 1&2 Ab, 4th Generation: NONREACTIVE

## 2020-08-02 LAB — GLUCOSE TOLERANCE, 1 HOUR (50G) W/O FASTING: Glucose, 1 Hr, gestational: 179 mg/dL — ABNORMAL HIGH (ref 65–139)

## 2020-08-06 LAB — URINE CULTURE, OB REFLEX

## 2020-08-06 LAB — CULTURE, OB URINE

## 2020-08-11 ENCOUNTER — Other Ambulatory Visit (INDEPENDENT_AMBULATORY_CARE_PROVIDER_SITE_OTHER): Payer: 59

## 2020-08-11 ENCOUNTER — Other Ambulatory Visit: Payer: Self-pay

## 2020-08-11 DIAGNOSIS — Z3402 Encounter for supervision of normal first pregnancy, second trimester: Secondary | ICD-10-CM

## 2020-08-11 NOTE — Progress Notes (Signed)
2 hr GTT only

## 2020-08-12 LAB — 2HR GTT W 1 HR, CARPENTER, 75 G
Glucose, 1 Hr, Gest: 156 mg/dL (ref 65–179)
Glucose, 2 Hr, Gest: 113 mg/dL (ref 65–152)
Glucose, Fasting, Gest: 71 mg/dL (ref 65–91)

## 2020-08-15 ENCOUNTER — Other Ambulatory Visit: Payer: Self-pay

## 2020-08-15 ENCOUNTER — Ambulatory Visit (INDEPENDENT_AMBULATORY_CARE_PROVIDER_SITE_OTHER): Payer: 59 | Admitting: *Deleted

## 2020-08-15 DIAGNOSIS — Z3401 Encounter for supervision of normal first pregnancy, first trimester: Secondary | ICD-10-CM | POA: Diagnosis not present

## 2020-08-25 DIAGNOSIS — Z3400 Encounter for supervision of normal first pregnancy, unspecified trimester: Secondary | ICD-10-CM

## 2020-08-27 ENCOUNTER — Encounter: Payer: Self-pay | Admitting: Osteopathic Medicine

## 2020-08-29 ENCOUNTER — Other Ambulatory Visit: Payer: Self-pay

## 2020-08-29 ENCOUNTER — Ambulatory Visit (INDEPENDENT_AMBULATORY_CARE_PROVIDER_SITE_OTHER): Payer: 59 | Admitting: Obstetrics & Gynecology

## 2020-08-29 ENCOUNTER — Telehealth: Payer: Self-pay

## 2020-08-29 DIAGNOSIS — Z3400 Encounter for supervision of normal first pregnancy, unspecified trimester: Secondary | ICD-10-CM

## 2020-08-29 NOTE — Telephone Encounter (Signed)
Pt left forms at the front desk for Dr. Lyn Hollingshead to fill out for Staff Health Assessment/ Medical Report for her job. Pt stated that she would like if you could alert her in mychart to let her know it's completed.Please also leave at the front desk. Paperwork is placed in message box.

## 2020-08-29 NOTE — Progress Notes (Signed)
   PRENATAL VISIT NOTE  Subjective:  Shelia Delgado is a 35 y.o. G1P0000 at [redacted]w[redacted]d being seen today for ongoing prenatal care.  She is currently monitored for the following issues for this low-risk pregnancy and has Dyshidrotic dermatitis; BMI 33.0-33.9,adult; and Supervision of low-risk first pregnancy on their problem list.  Patient reports N/V and fatigue improved; had one day of spotting after intercourse.  Contractions: Not present. Vag. Bleeding: Scant.  Movement: Absent. Denies leaking of fluid.   The following portions of the patient's history were reviewed and updated as appropriate: allergies, current medications, past family history, past medical history, past social history, past surgical history and problem list.   Objective:   Vitals:   08/29/20 1301  BP: 118/74  Pulse: 86  Weight: 176 lb (79.8 kg)    Fetal Status: Fetal Heart Rate (bpm): 154   Movement: Absent     General:  Alert, oriented and cooperative. Patient is in no acute distress.  Skin: Skin is warm and dry. No rash noted.   Cardiovascular: Normal heart rate noted  Respiratory: Normal respiratory effort, no problems with respiration noted  Abdomen: Soft, gravid, appropriate for gestational age.  Pain/Pressure: Absent     Pelvic: Cervical exam deferred        Extremities: Normal range of motion.  Edema: None  Mental Status: Normal mood and affect. Normal behavior. Normal judgment and thought content.   Assessment and Plan:  Pregnancy: G1P0000 at [redacted]w[redacted]d 1. Encounter for supervision of low-risk first pregnancy, antepartum AFP after 15 weeks within the week (getting drawn downstairs) Has anatomy US appt Encouraged Flu and Covid vaccinations Taking BP and flowing into EPic  Preterm labor symptoms and general obstetric precautions including but not limited to vaginal bleeding, contractions, leaking of fluid and fetal movement were reviewed in detail with the patient. Please refer to After Visit Summary for  other counseling recommendations.   No follow-ups on file.  Future Appointments  Date Time Provider Department Center  08/29/2020  1:15 PM Lesly Dukes, MD [facility]  09/27/2020 12:45 PM [facility] US4 [facility]  12/05/2020  1:20 PM Sunnie Nielsen, DO [facility] None    Elsie Lincoln, MD

## 2020-08-31 NOTE — Telephone Encounter (Signed)
Pt called requesting if provider can make an exception in completing her form. Per pt, she will be stopping by the clinic on Friday after her blood draw. She would like to pick up the completed form on that day. Pt is due to return to work on Friday. Pls advise, thanks.

## 2020-09-02 NOTE — Telephone Encounter (Signed)
It's in Vanicia's  inbasket, I finished it yesterday!  Can pick up today

## 2020-09-02 NOTE — Telephone Encounter (Signed)
Task completed. Pt was handed the completed form this afternoon. Copy of completed form placed in scan folder.

## 2020-09-05 LAB — ALPHA FETOPROTEIN, MATERNAL
AFP MoM: 1.18
AFP, Serum: 33.2 ng/mL
Calc'd Gestational Age: 15.4 weeks
Maternal Wt: 175 [lb_av]
Risk for ONTD: <1
Twins-AFP: 1

## 2020-09-09 ENCOUNTER — Encounter: Payer: Self-pay | Admitting: *Deleted

## 2020-09-19 ENCOUNTER — Other Ambulatory Visit: Payer: Self-pay | Admitting: Obstetrics & Gynecology

## 2020-09-19 DIAGNOSIS — Z3491 Encounter for supervision of normal pregnancy, unspecified, first trimester: Secondary | ICD-10-CM

## 2020-09-27 ENCOUNTER — Ambulatory Visit: Payer: 59

## 2020-09-30 ENCOUNTER — Ambulatory Visit: Payer: 59

## 2020-10-03 ENCOUNTER — Ambulatory Visit (INDEPENDENT_AMBULATORY_CARE_PROVIDER_SITE_OTHER): Payer: 59 | Admitting: Obstetrics & Gynecology

## 2020-10-03 ENCOUNTER — Other Ambulatory Visit: Payer: Self-pay

## 2020-10-03 VITALS — BP 116/71 | HR 93 | Wt 179.0 lb

## 2020-10-03 DIAGNOSIS — Z3A19 19 weeks gestation of pregnancy: Secondary | ICD-10-CM

## 2020-10-03 DIAGNOSIS — Z3400 Encounter for supervision of normal first pregnancy, unspecified trimester: Secondary | ICD-10-CM

## 2020-10-03 NOTE — Progress Notes (Signed)
   PRENATAL VISIT NOTE  Subjective:  Shelia Delgado is a 35 y.o. G1P0000 at [redacted]w[redacted]d being seen today for ongoing prenatal care.  She is currently monitored for the following issues for this low-risk pregnancy and has Dyshidrotic dermatitis; BMI 33.0-33.9,adult; and Supervision of low-risk first pregnancy on their problem list.  Patient reports no complaints.  Contractions: Not present. Vag. Bleeding: None.  Movement: Present. Denies leaking of fluid.   The following portions of the patient's history were reviewed and updated as appropriate: allergies, current medications, past family history, past medical history, past social history, past surgical history and problem list.   Objective:   Vitals:   10/03/20 1304  BP: 116/71  Pulse: 93  Weight: 179 lb (81.2 kg)    Fetal Status: Fetal Heart Rate (bpm): 149   Movement: Present     General:  Alert, oriented and cooperative. Patient is in no acute distress.  Skin: Skin is warm and dry. No rash noted.   Cardiovascular: Normal heart rate noted  Respiratory: Normal respiratory effort, no problems with respiration noted  Abdomen: Soft, gravid, appropriate for gestational age.  Pain/Pressure: Absent     Pelvic: Cervical exam deferred        Extremities: Normal range of motion.  Edema: None  Mental Status: Normal mood and affect. Normal behavior. Normal judgment and thought content.   Assessment and Plan:  Pregnancy: G1P0000 at [redacted]w[redacted]d 1. Encounter for supervision of low-risk first pregnancy, antepartum Anatomy US scheduled. Nausea subsided; continue to eat healthy meals and take pNV.   28 week labs at next visit.   Preterm labor symptoms and general obstetric precautions including but not limited to vaginal bleeding, contractions, leaking of fluid and fetal movement were reviewed in detail with the patient. Please refer to After Visit Summary for other counseling recommendations.     Future Appointments  Date Time Provider Department  Center  10/18/2020 12:45 PM [facility] US4 [facility]  12/05/2020  1:20 PM Sunnie Nielsen, DO [facility] None    Elsie Lincoln, MD

## 2020-10-18 ENCOUNTER — Ambulatory Visit: Payer: 59 | Attending: Obstetrics & Gynecology

## 2020-10-18 ENCOUNTER — Other Ambulatory Visit: Payer: Self-pay

## 2020-10-18 DIAGNOSIS — Z3491 Encounter for supervision of normal pregnancy, unspecified, first trimester: Secondary | ICD-10-CM

## 2020-12-02 ENCOUNTER — Ambulatory Visit (INDEPENDENT_AMBULATORY_CARE_PROVIDER_SITE_OTHER): Payer: 59 | Admitting: Certified Nurse Midwife

## 2020-12-02 ENCOUNTER — Other Ambulatory Visit: Payer: Self-pay

## 2020-12-02 VITALS — BP 121/77 | HR 91 | Wt 190.0 lb

## 2020-12-02 DIAGNOSIS — Z3403 Encounter for supervision of normal first pregnancy, third trimester: Secondary | ICD-10-CM

## 2020-12-02 DIAGNOSIS — Z3A28 28 weeks gestation of pregnancy: Secondary | ICD-10-CM

## 2020-12-02 NOTE — Progress Notes (Signed)
GAD: 2 PHQ9: 1 Pt undecided about tap. Will discuss with husband and might get at next visit

## 2020-12-02 NOTE — Progress Notes (Signed)
Subjective:  Akili Cuda is a 35 y.o. G1P0000 at [redacted]w[redacted]d being seen today for ongoing prenatal care.  She is currently monitored for the following issues for this low-risk pregnancy and has Dyshidrotic dermatitis; BMI 33.0-33.9,adult; and Supervision of low-risk first pregnancy on their problem list.  Patient reports no complaints.  Contractions: Not present. Vag. Bleeding: None.  Movement: Present. Denies leaking of fluid.   The following portions of the patient's history were reviewed and updated as appropriate: allergies, current medications, past family history, past medical history, past social history, past surgical history and problem list. Problem list updated.  Objective:   Vitals:   12/02/20 0810  BP: 121/77  Pulse: 91  Weight: 190 lb (86.2 kg)    Fetal Status: Fetal Heart Rate (bpm): 156 Fundal Height: 30 cm Movement: Present  Presentation: Homero Fellers Breech  General:  Alert, oriented and cooperative. Patient is in no acute distress.  Skin: Skin is warm and dry. No rash noted.   Cardiovascular: Normal heart rate noted  Respiratory: Normal respiratory effort, no problems with respiration noted  Abdomen: Soft, gravid, appropriate for gestational age. Pain/Pressure: Absent     Pelvic: Vag. Bleeding: None Vag D/C Character: Thin   Cervical exam deferred        Extremities: Normal range of motion.  Edema: Trace  Mental Status: Normal mood and affect. Normal behavior. Normal judgment and thought content.   Urinalysis:      Assessment and Plan:  Pregnancy: G1P0000 at [redacted]w[redacted]d  1. Encounter for supervision of low-risk first pregnancy in third trimester - 2Hr GTT w/ 1 Hr Carpenter 75 g - HIV antibody (with reflex) - CBC - RPR  2. [redacted] weeks gestation of pregnancy   Preterm labor symptoms and general obstetric precautions including but not limited to vaginal bleeding, contractions, leaking of fluid and fetal movement were reviewed in detail with the patient. Please refer to After  Visit Summary for other counseling recommendations.  Return in about 2 weeks (around 12/16/2020). virtual   Donette Larry, CNM

## 2020-12-05 ENCOUNTER — Encounter: Payer: Self-pay | Admitting: Osteopathic Medicine

## 2020-12-05 LAB — CBC
HCT: 35.6 % (ref 35.0–45.0)
Hemoglobin: 11.6 g/dL — ABNORMAL LOW (ref 11.7–15.5)
MCH: 28.5 pg (ref 27.0–33.0)
MCHC: 32.6 g/dL (ref 32.0–36.0)
MCV: 87.5 fL (ref 80.0–100.0)
MPV: 11 fL (ref 7.5–12.5)
Platelets: 307 10*3/uL (ref 140–400)
RBC: 4.07 10*6/uL (ref 3.80–5.10)
RDW: 12.6 % (ref 11.0–15.0)
WBC: 9.5 10*3/uL (ref 3.8–10.8)

## 2020-12-05 LAB — HIV ANTIBODY (ROUTINE TESTING W REFLEX): HIV 1&2 Ab, 4th Generation: NONREACTIVE

## 2020-12-05 LAB — 2HR GTT W 1 HR, CARPENTER, 75 G
Glucose, 1 Hr, Gest: 131 mg/dL (ref 65–179)
Glucose, 2 Hr, Gest: 82 mg/dL (ref 65–152)
Glucose, Fasting, Gest: 66 mg/dL (ref 65–91)

## 2020-12-05 LAB — RPR: RPR Ser Ql: NONREACTIVE

## 2020-12-06 ENCOUNTER — Encounter: Payer: Self-pay | Admitting: Osteopathic Medicine

## 2020-12-07 ENCOUNTER — Other Ambulatory Visit: Payer: Self-pay

## 2020-12-07 ENCOUNTER — Encounter: Payer: Self-pay | Admitting: Osteopathic Medicine

## 2020-12-07 ENCOUNTER — Ambulatory Visit (INDEPENDENT_AMBULATORY_CARE_PROVIDER_SITE_OTHER): Payer: 59 | Admitting: Osteopathic Medicine

## 2020-12-07 VITALS — BP 114/74 | HR 95 | Temp 98.7°F | Wt 193.1 lb

## 2020-12-07 DIAGNOSIS — Z0000 Encounter for general adult medical examination without abnormal findings: Secondary | ICD-10-CM | POA: Diagnosis not present

## 2020-12-07 DIAGNOSIS — L309 Dermatitis, unspecified: Secondary | ICD-10-CM | POA: Diagnosis not present

## 2020-12-07 DIAGNOSIS — R6889 Other general symptoms and signs: Secondary | ICD-10-CM | POA: Diagnosis not present

## 2020-12-07 MED ORDER — TRIAMCINOLONE ACETONIDE 0.1 % EX CREA: 1.0000 "application " | TOPICAL_CREAM | Freq: Two times a day (BID) | CUTANEOUS | 2 refills | Status: DC

## 2020-12-07 NOTE — Progress Notes (Signed)
Shelia Delgado is a 35 y.o. female who presents to  [place] at [facility]  today, 12/07/20, seeking care for the following:  . Annual physical exam  . Eyelid eczema . Globus sensation, tickle in the throat a bit unusual. Taking Zyrtec, Flonase, Sudafed and not much different but these are helping allergies. No choking, no voice changes      ASSESSMENT & PLAN with other pertinent findings:  The primary encounter diagnosis was Annual physical exam. Diagnoses of Eczema, unspecified type and Throat symptom were also pertinent to this visit.   1. Annual physical exam See below  2. Eczema, unspecified type Low potency steroid to pharmacy   3. Throat symptom Exam normal, low threshold for ENT referral    Patient Instructions  General Preventive Care  Most recent routine screening labs: will defer until 3-6 mos postpartum.   Blood pressure goal 130/80 or less.   Tobacco: don't!   Alcohol: responsible moderation is ok for most adults - if you have concerns about your alcohol intake, please talk to me!   Exercise: as tolerated to reduce risk of cardiovascular disease and diabetes. Strength training will also prevent osteoporosis.   Mental health: if need for mental health care (medicines, counseling, other), or concerns about moods, please let me know!   Sexual / Reproductive health: if need for STD testing, or if concerns with libido/pain problems, if need to discuss family planning, please let me or OBGYN know!   Advanced Directive: Living Will and/or Healthcare Power of Attorney recommended for all adults, regardless of age or health.  Vaccines  Flu vaccine: for almost everyone, every fall.   Shingles vaccine: after age 32.   Pneumonia vaccines: after age 30  Tetanus booster: every 10 years / 3rd trimester of pregnancy  HPV vaccine: series completed  COVID vaccine: STRONGLY RECOMMENDED  Cancer screenings   Colon  cancer screening: for everyone age 75-75.   Breast cancer screening: mammogram at age 74  Cervical cancer screening: if normal, Pap every 5 years if over 38   Lung cancer screening: not needed for non-smokers Infection screenings  . HIV and Hepatitis C: screening done, repeat as needed . Gonorrhea/Chlamydia, other STI: screening as needed . TB: certain at-risk populations Other . Bone Density Test: recommended for women at age 14   No orders of the defined types were placed in this encounter.   No orders of the defined types were placed in this encounter.    See below for relevant physical exam findings  See below for recent lab and imaging results reviewed  Medications, allergies, PMH, PSH, SocH, FamH reviewed below    Follow-up instructions: Return in about 1 year (around 12/07/2021) for ANNUAL CHECK-UP - SEE [place] SOONER IF NEEDED.                                        Exam:  BP 114/74 (BP Location: Left Arm, Patient Position: Sitting, Cuff Size: Normal)   Pulse 95   Temp 98.7 F (37.1 C) (Oral)   Wt 193 lb 1.9 oz (87.6 kg)   LMP 05/17/2020   BMI 33.15 kg/m   Constitutional: VS see above. General Appearance: alert, well-developed, well-nourished, NAD  Neck: No masses, trachea midline. No thyromegaly. Nontender, normal ROM.   Respiratory: Normal respiratory effort. no wheeze, no rhonchi, no rales  Cardiovascular: S1/S2 normal, no  murmur, no rub/gallop auscultated. RRR.   Musculoskeletal: Gait normal. Symmetric and independent movement of all extremities  Abdominal: gravid uterus limits exam   Neurological: Normal balance/coordination. No tremor.  Skin: warm, dry, intact.   Psychiatric: Normal judgment/insight. Normal mood and affect. Oriented x3.   Current Meds  Medication Sig  . fluticasone (FLONASE) 50 MCG/ACT nasal spray Place into both nostrils daily.  . Polyethylene Glycol 3350 (MIRALAX PO) Take by mouth.  .  Prenatal w/o A Vit-Fe Fum-FA (PRENATAL VITAMIN W/FE, FA) 29-1 MG CHEW Chew 1 tablet by mouth daily.   Current Facility-Administered Medications for the 12/07/20 encounter (Office Visit) with Emeterio Reeve, DO  Medication  . aspirin chewable tablet 81 mg    Allergies  Allergen Reactions  . Amoxicillin Rash    Mild rash after taking for 1 week    Patient Active Problem List   Diagnosis Date Noted  . Supervision of low-risk first pregnancy 08/01/2020  . BMI 33.0-33.9,adult 01/26/2019  . Dyshidrotic dermatitis     Family History  Problem Relation Age of Onset  . Hypertension Mother   . Skin cancer Mother   . Hypertension Father   . Atrial fibrillation Father   . Diabetes Father   . Diabetes Maternal Grandfather   . Lung disease Maternal Grandfather   . Heart attack Paternal Grandfather   . Heart attack Paternal Uncle   . Healthy Brother     Social History   Tobacco Use  Smoking Status Never Smoker  Smokeless Tobacco Never Used    Past Surgical History:  Procedure Laterality Date  . WISDOM TOOTH EXTRACTION      Immunization History  Administered Date(s) Administered  . Hepatitis B 04/06/1997, 05/16/1999, 10/19/1999  . Hpv-Unspecified 11/05/2005, 03/18/2007, 07/22/2007  . MMR 11/19/1988, 04/06/1997  . PPD Test 06/21/2018  . Tdap 11/05/2005, 05/21/2017  . Varicella 05/16/1999, 07/28/2008    Recent Results (from the past 2160 hour(s))  2Hr GTT w/ 1 Hr Carpenter 75 g     Status: None   Collection Time: 12/02/20  8:51 AM  Result Value Ref Range   Glucose, Fasting, Gest 66 65 - 91 mg/dL   Glucose, 1 Hr, Gest 131 65 - 179 mg/dL   Glucose, 2 Hr, Gest 82 65 - 152 mg/dL   Comment      Comment: ADA/IADPSG Guidelines: One or more values greater than the above reference intervals are diagnostic of gestational  diabetes. Fasting plasma glucose > or = 126 mg/dL  indicates overt diabetes in pregnancy.   HIV antibody (with reflex)     Status: None   Collection  Time: 12/02/20  8:51 AM  Result Value Ref Range   HIV 1&2 Ab, 4th Generation NON-REACTIVE NON-REACTIVE    Comment: HIV-1 antigen and HIV-1/HIV-2 antibodies were not detected. There is no laboratory evidence of HIV infection. Marland Kitchen PLEASE NOTE: This information has been disclosed to you from records whose confidentiality may be protected by state law.  If your state requires such protection, then the state law prohibits you from making any further disclosure of the information without the specific written consent of the person to whom it pertains, or as otherwise permitted by law. A general authorization for the release of medical or other information is NOT sufficient for this purpose. . For additional information please refer to http://education.quest[employer].com/faq/FAQ106 (This link is being provided for informational/ educational purposes only.) . Marland Kitchen The performance of this assay has not been clinically validated in patients less than 3 years old. Marland Kitchen  CBC     Status: Abnormal   Collection Time: 12/02/20  8:51 AM  Result Value Ref Range   WBC 9.5 3.8 - 10.8 Thousand/uL   RBC 4.07 3.80 - 5.10 Million/uL   Hemoglobin 11.6 (L) 11.7 - 15.5 g/dL   HCT 35.6 35.0 - 45.0 %   MCV 87.5 80.0 - 100.0 fL   MCH 28.5 27.0 - 33.0 pg   MCHC 32.6 32.0 - 36.0 g/dL   RDW 12.6 11.0 - 15.0 %   Platelets 307 140 - 400 Thousand/uL   MPV 11.0 7.5 - 12.5 fL  RPR     Status: None   Collection Time: 12/02/20  8:51 AM  Result Value Ref Range   RPR Ser Ql NON-REACTIVE NON-REACTIVE    No results found.     All questions at time of visit were answered - patient instructed to contact office with any additional concerns or updates. ER/RTC precautions were reviewed with the patient as applicable.   Please note: manual typing as well as voice recognition software may have been used to produce this document - typos may escape review. Please contact Dr. Sheppard Coil for any needed clarifications.

## 2020-12-07 NOTE — Patient Instructions (Signed)
General Preventive Care  Most recent routine screening labs: will defer until 3-6 mos postpartum.   Blood pressure goal 130/80 or less.   Tobacco: don't!   Alcohol: responsible moderation is ok for most adults - if you have concerns about your alcohol intake, please talk to me!   Exercise: as tolerated to reduce risk of cardiovascular disease and diabetes. Strength training will also prevent osteoporosis.   Mental health: if need for mental health care (medicines, counseling, other), or concerns about moods, please let me know!   Sexual / Reproductive health: if need for STD testing, or if concerns with libido/pain problems, if need to discuss family planning, please let me or OBGYN know!   Advanced Directive: Living Will and/or Healthcare Power of Attorney recommended for all adults, regardless of age or health.  Vaccines  Flu vaccine: for almost everyone, every fall.   Shingles vaccine: after age 105.   Pneumonia vaccines: after age 55  Tetanus booster: every 10 years / 3rd trimester of pregnancy  HPV vaccine: series completed  COVID vaccine: STRONGLY RECOMMENDED  Cancer screenings   Colon cancer screening: for everyone age 53-75.   Breast cancer screening: mammogram at age 5  Cervical cancer screening: if normal, Pap every 5 years if over 30   Lung cancer screening: not needed for non-smokers Infection screenings  . HIV and Hepatitis C: screening done, repeat as needed . Gonorrhea/Chlamydia, other STI: screening as needed . TB: certain at-risk populations Other . Bone Density Test: recommended for women at age 80

## 2020-12-15 ENCOUNTER — Telehealth (INDEPENDENT_AMBULATORY_CARE_PROVIDER_SITE_OTHER): Payer: 59 | Admitting: Obstetrics and Gynecology

## 2020-12-15 ENCOUNTER — Encounter: Payer: Self-pay | Admitting: Obstetrics and Gynecology

## 2020-12-15 DIAGNOSIS — Z3A30 30 weeks gestation of pregnancy: Secondary | ICD-10-CM

## 2020-12-15 DIAGNOSIS — Z3403 Encounter for supervision of normal first pregnancy, third trimester: Secondary | ICD-10-CM

## 2020-12-15 NOTE — Progress Notes (Signed)
   OBSTETRICS PRENATAL VIRTUAL VISIT ENCOUNTER NOTE  Provider location: Center for [facility] [place]   Patient location: Home  I connected with Shelia Delgado on 12/15/20 at 10:45 AM EDT by MyChart Video Encounter and verified that I am speaking with the correct person using two identifiers. I discussed the limitations, risks, security and privacy concerns of performing an evaluation and management service virtually and the availability of in person appointments. I also discussed with the patient that there may be a patient responsible charge related to this service. The patient expressed understanding and agreed to proceed. Subjective:  Shelia Delgado is a 35 y.o. G1P0000 at [redacted]w[redacted]d being seen today for ongoing prenatal care.  She is currently monitored for the following issues for this low-risk pregnancy and has Dyshidrotic dermatitis; BMI 33.0-33.9,adult; and Supervision of low-risk first pregnancy on their problem list.  Patient reports no complaints.  Contractions: Not present. Vag. Bleeding: None.  Movement: Present. Denies any leaking of fluid.   The following portions of the patient's history were reviewed and updated as appropriate: allergies, current medications, past family history, past medical history, past social history, past surgical history and problem list.   Objective:   Vitals:   12/15/20 1017  BP: (!) 114/58  Pulse: 90  Weight: 192 lb (87.1 kg)    Fetal Status:     Movement: Present     General:  Alert, oriented and cooperative. Patient is in no acute distress.  Respiratory: Normal respiratory effort, no problems with respiration noted  Mental Status: Normal mood and affect. Normal behavior. Normal judgment and thought content.  Rest of physical exam deferred due to type of encounter  Imaging: No results found.  Assessment and Plan:  Pregnancy: G1P0000 at [redacted]w[redacted]d 1. Encounter for supervision of low-risk first pregnancy in third  trimester Patient is doing well without complaints Patient plans depo-provera for contraception She has decided on a pediatrician  Preterm labor symptoms and general obstetric precautions including but not limited to vaginal bleeding, contractions, leaking of fluid and fetal movement were reviewed in detail with the patient. I discussed the assessment and treatment plan with the patient. The patient was provided an opportunity to ask questions and all were answered. The patient agreed with the plan and demonstrated an understanding of the instructions. The patient was advised to call back or seek an in-person office evaluation/go to MAU at [facility] for any urgent or concerning symptoms. Please refer to After Visit Summary for other counseling recommendations.   I provided 15 minutes of face-to-face time during this encounter.  Return in about 2 weeks (around 12/29/2020) for in person, ROB, Low risk.  Future Appointments  Date Time Provider Department Center  12/15/2020 10:45 AM Tejon Gracie, Gigi Gin, MD [facility]  12/26/2020  1:45 PM Lesly Dukes, MD [facility]  12/11/2021  1:20 PM Sunnie Nielsen, DO [facility] None    Catalina Antigua, MD Center for [facility], [facility]

## 2020-12-20 ENCOUNTER — Encounter: Payer: 59 | Admitting: Advanced Practice Midwife

## 2020-12-26 ENCOUNTER — Other Ambulatory Visit: Payer: Self-pay

## 2020-12-26 ENCOUNTER — Encounter: Payer: Self-pay | Admitting: Obstetrics & Gynecology

## 2020-12-26 ENCOUNTER — Ambulatory Visit (INDEPENDENT_AMBULATORY_CARE_PROVIDER_SITE_OTHER): Payer: 59 | Admitting: Obstetrics & Gynecology

## 2020-12-26 VITALS — BP 114/67 | HR 85 | Wt 197.0 lb

## 2020-12-26 DIAGNOSIS — Z23 Encounter for immunization: Secondary | ICD-10-CM

## 2020-12-26 DIAGNOSIS — Z3A31 31 weeks gestation of pregnancy: Secondary | ICD-10-CM

## 2020-12-26 DIAGNOSIS — Z3403 Encounter for supervision of normal first pregnancy, third trimester: Secondary | ICD-10-CM

## 2020-12-26 NOTE — Progress Notes (Signed)
   PRENATAL VISIT NOTE  Subjective:  Shelia Delgado is a 35 y.o. G1P0000 at [redacted]w[redacted]d being seen today for ongoing prenatal care.  She is currently monitored for the following issues for this low-risk pregnancy and has Dyshidrotic dermatitis; BMI 33.0-33.9,adult; and Supervision of low-risk first pregnancy on their problem list.  Patient reports no complaints.   .  .   . Denies leaking of fluid.   The following portions of the patient's history were reviewed and updated as appropriate: allergies, current medications, past family history, past medical history, past social history, past surgical history and problem list.   Objective:  There were no vitals filed for this visit.  Fetal Status:           General:  Alert, oriented and cooperative. Patient is in no acute distress.  Skin: Skin is warm and dry. No rash noted.   Cardiovascular: Normal heart rate noted  Respiratory: Normal respiratory effort, no problems with respiration noted  Abdomen: Soft, gravid, appropriate for gestational age.        Pelvic: Cervical exam deferred        Extremities: Normal range of motion.     Mental Status: Normal mood and affect. Normal behavior. Normal judgment and thought content.   Assessment and Plan:  Pregnancy: G1P0000 at [redacted]w[redacted]d 1. [redacted] weeks gestation of pregnancy BRx values nml and flowing into chart.  2.  Pt enrolled in birthing classes.   Preterm labor symptoms and general obstetric precautions including but not limited to vaginal bleeding, contractions, leaking of fluid and fetal movement were reviewed in detail with the patient. Please refer to After Visit Summary for other counseling recommendations.   No follow-ups on file.  Future Appointments  Date Time Provider Department Center  12/26/2020  1:45 PM Lesly Dukes, MD [facility]  01/10/2021  2:00 PM Hurshel Party CNM [facility]  01/26/2021  1:45 PM Lesly Dukes, MD [facility]  02/02/2021   1:15 PM Lesly Dukes, MD [facility]  02/09/2021 10:45 AM Lesly Dukes, MD [facility]  02/14/2021  1:50 PM Leftwich-Kirby, Wilmer Floor, CNM [facility]  12/11/2021  1:20 PM Sunnie Nielsen, DO [facility] None    Elsie Lincoln, MD

## 2021-01-10 ENCOUNTER — Telehealth: Payer: 59 | Admitting: Advanced Practice Midwife

## 2021-01-26 ENCOUNTER — Ambulatory Visit (INDEPENDENT_AMBULATORY_CARE_PROVIDER_SITE_OTHER): Payer: 59 | Admitting: Obstetrics & Gynecology

## 2021-01-26 ENCOUNTER — Other Ambulatory Visit: Payer: Self-pay

## 2021-01-26 ENCOUNTER — Other Ambulatory Visit (HOSPITAL_COMMUNITY)
Admission: RE | Admit: 2021-01-26 | Discharge: 2021-01-26 | Disposition: A | Discharge status: Home / Self Care | Payer: 59 | Source: Ambulatory Visit | Attending: Obstetrics & Gynecology | Admitting: Obstetrics & Gynecology

## 2021-01-26 VITALS — BP 127/74 | HR 90 | Wt 208.0 lb

## 2021-01-26 DIAGNOSIS — Z3403 Encounter for supervision of normal first pregnancy, third trimester: Secondary | ICD-10-CM

## 2021-01-26 LAB — OB RESULTS CONSOLE GBS: GBS: NEGATIVE

## 2021-01-26 NOTE — Progress Notes (Signed)
   PRENATAL VISIT NOTE  Subjective:  Shelia Delgado is a 35 y.o. G1P0000 at [redacted]w[redacted]d being seen today for ongoing prenatal care.  She is currently monitored for the following issues for this low-risk pregnancy and has Dyshidrotic dermatitis; BMI 33.0-33.9,adult; and Supervision of low-risk first pregnancy on their problem list.  Patient reports no complaints.  Contractions: Not present. Vag. Bleeding: None.  Movement: Present. Denies leaking of fluid.   The following portions of the patient's history were reviewed and updated as appropriate: allergies, current medications, past family history, past medical history, past social history, past surgical history and problem list.   Objective:   Vitals:   01/26/21 1351  BP: 127/74  Pulse: 90  Weight: 208 lb (94.3 kg)    Fetal Status:     Movement: Present     General:  Alert, oriented and cooperative. Patient is in no acute distress.  Skin: Skin is warm and dry. No rash noted.   Cardiovascular: Normal heart rate noted  Respiratory: Normal respiratory effort, no problems with respiration noted  Abdomen: Soft, gravid, appropriate for gestational age.  Pain/Pressure: Absent     Pelvic: Cervical exam performed in the presence of a chaperone      loose 1/50/soft/VTX  Extremities: Normal range of motion.  Edema: None  Mental Status: Normal mood and affect. Normal behavior. Normal judgment and thought content.   Assessment and Plan:  Pregnancy: G1P0000 at [redacted]w[redacted]d 1. Encounter for supervision of normal first pregnancy in third trimester - Culture, beta strep (group b only) - Cervicovaginal ancillary only( [place]) - Sixe > dates; get [place] for growth.  Term labor symptoms and general obstetric precautions including but not limited to vaginal bleeding, contractions, leaking of fluid and fetal movement were reviewed in detail with the patient. Please refer to After Visit Summary for other counseling recommendations.   No follow-ups on  file.  Future Appointments  Date Time Provider Department Center  02/09/2021 10:45 AM Lesly Dukes, MD [facility]  02/14/2021  1:50 PM Hurshel Party, CNM [facility]  12/11/2021  1:20 PM Sunnie Nielsen, DO [facility] None    Elsie Lincoln, MD

## 2021-01-27 LAB — CERVICOVAGINAL ANCILLARY ONLY
Chlamydia: NEGATIVE
Comment: NEGATIVE
Comment: NORMAL
Neisseria Gonorrhea: NEGATIVE

## 2021-01-29 LAB — CULTURE, BETA STREP (GROUP B ONLY)
MICRO NUMBER:: 12095116
SPECIMEN QUALITY:: ADEQUATE

## 2021-02-01 ENCOUNTER — Ambulatory Visit: Payer: 59 | Admitting: *Deleted

## 2021-02-01 ENCOUNTER — Other Ambulatory Visit: Payer: Self-pay

## 2021-02-01 ENCOUNTER — Ambulatory Visit: Payer: 59 | Attending: Obstetrics & Gynecology

## 2021-02-01 ENCOUNTER — Encounter: Payer: Self-pay | Admitting: *Deleted

## 2021-02-01 VITALS — BP 131/72 | HR 92

## 2021-02-01 DIAGNOSIS — Z6833 Body mass index (BMI) 33.0-33.9, adult: Secondary | ICD-10-CM | POA: Diagnosis present

## 2021-02-01 DIAGNOSIS — O99213 Obesity complicating pregnancy, third trimester: Secondary | ICD-10-CM | POA: Diagnosis not present

## 2021-02-01 DIAGNOSIS — O26843 Uterine size-date discrepancy, third trimester: Secondary | ICD-10-CM | POA: Diagnosis not present

## 2021-02-01 DIAGNOSIS — Z3A37 37 weeks gestation of pregnancy: Secondary | ICD-10-CM | POA: Diagnosis not present

## 2021-02-01 DIAGNOSIS — E669 Obesity, unspecified: Secondary | ICD-10-CM

## 2021-02-02 ENCOUNTER — Encounter: Payer: 59 | Admitting: Obstetrics & Gynecology

## 2021-02-09 ENCOUNTER — Other Ambulatory Visit: Payer: Self-pay

## 2021-02-09 ENCOUNTER — Ambulatory Visit (INDEPENDENT_AMBULATORY_CARE_PROVIDER_SITE_OTHER): Payer: 59 | Admitting: Obstetrics & Gynecology

## 2021-02-09 VITALS — BP 132/82 | HR 87 | Wt 210.0 lb

## 2021-02-09 DIAGNOSIS — Z3403 Encounter for supervision of normal first pregnancy, third trimester: Secondary | ICD-10-CM

## 2021-02-09 NOTE — Progress Notes (Signed)
   PRENATAL VISIT NOTE  Subjective:  Shelia Delgado is a 35 y.o. G1P0000 at [redacted]w[redacted]d being seen today for ongoing prenatal care.  She is currently monitored for the following issues for this low-risk pregnancy and has Dyshidrotic dermatitis; BMI 33.0-33.9,adult; and Supervision of low-risk first pregnancy on their problem list.  Patient reports no complaints.   .  .   . Denies leaking of fluid.   The following portions of the patient's history were reviewed and updated as appropriate: allergies, current medications, past family history, past medical history, past social history, past surgical history and problem list.   Objective:  There were no vitals filed for this visit.  Fetal Status:           General:  Alert, oriented and cooperative. Patient is in no acute distress.  Skin: Skin is warm and dry. No rash noted.   Cardiovascular: Normal heart rate noted  Respiratory: Normal respiratory effort, no problems with respiration noted  Abdomen: Soft, gravid, appropriate for gestational age.        Pelvic: Cervical exam performed in the presence of a chaperone      almost2/50/soft/posterior' vertex  Extremities: Normal range of motion.     Mental Status: Normal mood and affect. Normal behavior. Normal judgment and thought content.   Assessment and Plan:  Pregnancy: G1P0000 at [redacted]w[redacted]d 1. Encounter for supervision of low-risk first pregnancy in third trimester Growth [place] 76%, nml fluid; BPs nml on Brx; discussed ARRIVE trial and patient would like to wait for labor to come on its own right now.  Term- labor symptoms and general obstetric precautions including but not limited to vaginal bleeding, contractions, leaking of fluid and fetal movement were reviewed in detail with the patient. Please refer to After Visit Summary for other counseling recommendations.   No follow-ups on file.  Future Appointments  Date Time Provider Department Center  02/14/2021  1:50 PM Rasch, Harolyn Rutherford, NP [facility]  [facility]  12/11/2021  1:20 PM Sunnie Nielsen, DO [facility] None    Elsie Lincoln, MD

## 2021-02-14 ENCOUNTER — Ambulatory Visit (INDEPENDENT_AMBULATORY_CARE_PROVIDER_SITE_OTHER): Payer: 59 | Admitting: Obstetrics and Gynecology

## 2021-02-14 ENCOUNTER — Other Ambulatory Visit: Payer: Self-pay

## 2021-02-14 VITALS — BP 134/82 | HR 81 | Wt 213.0 lb

## 2021-02-14 DIAGNOSIS — Z3403 Encounter for supervision of normal first pregnancy, third trimester: Secondary | ICD-10-CM

## 2021-02-14 NOTE — Progress Notes (Signed)
   PRENATAL VISIT NOTE  Subjective:  Shelia Delgado is a 35 y.o. G1P0000 at [redacted]w[redacted]d being seen today for ongoing prenatal care.  She is currently monitored for the following issues for this low-risk pregnancy and has Dyshidrotic dermatitis; BMI 33.0-33.9,adult; and Supervision of low-risk first pregnancy on their problem list.  Patient reports no complaints.  Contractions: Not present. Vag. Bleeding: None.  Movement: Present. Denies leaking of fluid.   The following portions of the patient's history were reviewed and updated as appropriate: allergies, current medications, past family history, past medical history, past social history, past surgical history and problem list.   Objective:   Vitals:   02/14/21 1354  BP: 134/82  Pulse: 81  Weight: 213 lb (96.6 kg)    Fetal Status: Fetal Heart Rate (bpm): 134 Fundal Height: 39 cm Movement: Present  Presentation: Vertex  General:  Alert, oriented and cooperative. Patient is in no acute distress.  Skin: Skin is warm and dry. No rash noted.   Cardiovascular: Normal heart rate noted  Respiratory: Normal respiratory effort, no problems with respiration noted  Abdomen: Soft, gravid, appropriate for gestational age.  Pain/Pressure: Present     Pelvic: Cervical exam performed in the presence of a chaperone Dilation: 1.5 Effacement (%): 50 Station: -2  Extremities: Normal range of motion.  Edema: None  Mental Status: Normal mood and affect. Normal behavior. Normal judgment and thought content.   Assessment and Plan:  Pregnancy: G1P0000 at [redacted]w[redacted]d   1. Encounter for supervision of low-risk first pregnancy in third trimester  Discussed starting primrose oil Discussed [facility] location when to go to MAU 1 week f/u   Term labor symptoms and general obstetric precautions including but not limited to vaginal bleeding, contractions, leaking of fluid and fetal movement were reviewed in detail with the patient. Please refer to After Visit Summary for other  counseling recommendations.   No follow-ups on file.  Future Appointments  Date Time Provider Department Center  12/11/2021  1:20 PM Sunnie Nielsen, DO [facility] None    Venia Carbon, NP

## 2021-02-14 NOTE — Patient Instructions (Signed)

## 2021-02-20 ENCOUNTER — Ambulatory Visit (INDEPENDENT_AMBULATORY_CARE_PROVIDER_SITE_OTHER): Payer: 59 | Admitting: Obstetrics & Gynecology

## 2021-02-20 ENCOUNTER — Other Ambulatory Visit: Payer: Self-pay

## 2021-02-20 DIAGNOSIS — Z3403 Encounter for supervision of normal first pregnancy, third trimester: Secondary | ICD-10-CM

## 2021-02-20 NOTE — Progress Notes (Signed)
   PRENATAL VISIT NOTE  Subjective:  Shelia Delgado is a 35 y.o. G1P0000 at [redacted]w[redacted]d being seen today for ongoing prenatal care.  She is currently monitored for the following issues for this low-risk pregnancy and has Dyshidrotic dermatitis; BMI 33.0-33.9,adult; and Supervision of low-risk first pregnancy on their problem list.  Patient reports no complaints.  Contractions: Not present. Vag. Bleeding: None.  Movement: Present. Denies leaking of fluid.   The following portions of the patient's history were reviewed and updated as appropriate: allergies, current medications, past family history, past medical history, past social history, past surgical history and problem list.   Objective:   Vitals:   02/20/21 1559  BP: 130/81  Pulse: 78  Weight: 217 lb (98.4 kg)    Fetal Status: Fetal Heart Rate (bpm): 154   Movement: Present     General:  Alert, oriented and cooperative. Patient is in no acute distress.  Skin: Skin is warm and dry. No rash noted.   Cardiovascular: Normal heart rate noted  Respiratory: Normal respiratory effort, no problems with respiration noted  Abdomen: Soft, gravid, appropriate for gestational age.  Pain/Pressure: Present     Pelvic: Cervical exam performed in the presence of a chaperone     2/60/-2 anterior; membranes stripped   Extremities: Normal range of motion.  Edema: Trace  Mental Status: Normal mood and affect. Normal behavior. Normal judgment and thought content.   Assessment and Plan:  Pregnancy: G1P0000 at [redacted]w[redacted]d 1. Encounter for supervision of low-risk first pregnancy in third trimester Membranes stripped; reviewed arrive trial.  Pt would like to avoid induction.  NST Thursday if no labor.   Term labor symptoms and general obstetric precautions including but not limited to vaginal bleeding, contractions, leaking of fluid and fetal movement were reviewed in detail with the patient. Please refer to After Visit Summary for other counseling  recommendations.    Future Appointments  Date Time Provider Department Center  12/11/2021  1:20 PM Sunnie Nielsen, DO [facility] None    Elsie Lincoln, MD

## 2021-02-21 ENCOUNTER — Inpatient Hospital Stay (HOSPITAL_COMMUNITY)
Admission: AD | Admit: 2021-02-21 | Discharge: 2021-02-23 | DRG: 807 | Disposition: A | Discharge status: Home / Self Care | Payer: 59 | Attending: Obstetrics & Gynecology | Admitting: Obstetrics & Gynecology

## 2021-02-21 ENCOUNTER — Inpatient Hospital Stay (HOSPITAL_COMMUNITY): Admit: 2021-02-21 | Payer: Self-pay

## 2021-02-21 ENCOUNTER — Encounter (HOSPITAL_COMMUNITY): Payer: Self-pay | Admitting: Obstetrics & Gynecology

## 2021-02-21 ENCOUNTER — Other Ambulatory Visit: Payer: Self-pay

## 2021-02-21 DIAGNOSIS — Z3A40 40 weeks gestation of pregnancy: Secondary | ICD-10-CM

## 2021-02-21 DIAGNOSIS — Z20822 Contact with and (suspected) exposure to covid-19: Secondary | ICD-10-CM | POA: Diagnosis present

## 2021-02-21 DIAGNOSIS — O26893 Other specified pregnancy related conditions, third trimester: Secondary | ICD-10-CM | POA: Diagnosis present

## 2021-02-21 DIAGNOSIS — Z3400 Encounter for supervision of normal first pregnancy, unspecified trimester: Secondary | ICD-10-CM

## 2021-02-21 DIAGNOSIS — O134 Gestational [pregnancy-induced] hypertension without significant proteinuria, complicating childbirth: Secondary | ICD-10-CM | POA: Diagnosis present

## 2021-02-21 DIAGNOSIS — O139 Gestational [pregnancy-induced] hypertension without significant proteinuria, unspecified trimester: Secondary | ICD-10-CM | POA: Diagnosis not present

## 2021-02-21 DIAGNOSIS — O4290 Premature rupture of membranes, unspecified as to length of time between rupture and onset of labor, unspecified weeks of gestation: Secondary | ICD-10-CM | POA: Diagnosis present

## 2021-02-21 HISTORY — DX: Other specified health status: Z78.9

## 2021-02-21 LAB — COMPREHENSIVE METABOLIC PANEL
ALT: 25 U/L (ref 0–44)
AST: 35 U/L (ref 15–41)
Albumin: 2.8 g/dL — ABNORMAL LOW (ref 3.5–5.0)
Alkaline Phosphatase: 123 U/L (ref 38–126)
Anion gap: 13 (ref 5–15)
BUN: 8 mg/dL (ref 6–20)
CO2: 18 mmol/L — ABNORMAL LOW (ref 22–32)
Calcium: 9.5 mg/dL (ref 8.9–10.3)
Chloride: 103 mmol/L (ref 98–111)
Creatinine, Ser: 0.66 mg/dL (ref 0.44–1.00)
GFR, Estimated: >60 mL/min (ref >60)
Glucose, Bld: 99 mg/dL (ref 70–99)
Potassium: 3.9 mmol/L (ref 3.5–5.1)
Sodium: 134 mmol/L — ABNORMAL LOW (ref 135–145)
Total Bilirubin: 0.3 mg/dL (ref 0.3–1.2)
Total Protein: 6.7 g/dL (ref 6.5–8.1)

## 2021-02-21 LAB — CBC WITH DIFFERENTIAL/PLATELET
Abs Immature Granulocytes: 0.2 10*3/uL — ABNORMAL HIGH (ref 0.00–0.07)
Basophils Absolute: 0.1 10*3/uL (ref 0.0–0.1)
Basophils Relative: 0 %
Eosinophils Absolute: 0 10*3/uL (ref 0.0–0.5)
Eosinophils Relative: 0 %
HCT: 39.2 % (ref 36.0–46.0)
Hemoglobin: 12.7 g/dL (ref 12.0–15.0)
Immature Granulocytes: 1 %
Lymphocytes Relative: 8 %
Lymphs Abs: 1.5 10*3/uL (ref 0.7–4.0)
MCH: 29.5 pg (ref 26.0–34.0)
MCHC: 32.4 g/dL (ref 30.0–36.0)
MCV: 91 fL (ref 80.0–100.0)
Monocytes Absolute: 0.8 10*3/uL (ref 0.1–1.0)
Monocytes Relative: 4 %
Neutro Abs: 15.8 10*3/uL — ABNORMAL HIGH (ref 1.7–7.7)
Neutrophils Relative %: 87 %
Platelets: 300 10*3/uL (ref 150–400)
RBC: 4.31 MIL/uL (ref 3.87–5.11)
RDW: 15 % (ref 11.5–15.5)
WBC: 18.3 10*3/uL — ABNORMAL HIGH (ref 4.0–10.5)
nRBC: 0 % (ref 0.0–0.2)

## 2021-02-21 LAB — RESP PANEL BY RT-PCR (FLU A&B, COVID) ARPGX2
Influenza A by PCR: NEGATIVE
Influenza B by PCR: NEGATIVE
SARS Coronavirus 2 by RT PCR: NEGATIVE

## 2021-02-21 LAB — TYPE AND SCREEN
ABO/RH(D): O POS
Antibody Screen: NEGATIVE

## 2021-02-21 LAB — PROTEIN / CREATININE RATIO, URINE
Creatinine, Urine: 73.19 mg/dL
Protein Creatinine Ratio: 0.14 mg/mg{Cre} (ref 0.00–0.15)
Total Protein, Urine: 10 mg/dL

## 2021-02-21 LAB — POCT FERN TEST: POCT Fern Test: POSITIVE

## 2021-02-21 LAB — SARS CORONAVIRUS 2 (TAT 6-24 HRS): SARS Coronavirus 2: NEGATIVE

## 2021-02-21 MED ORDER — ONDANSETRON HCL 4 MG/2ML IJ SOLN: 4.0000 mg | Freq: Four times a day (QID) | INTRAMUSCULAR | Status: DC | PRN

## 2021-02-21 MED ORDER — ACETAMINOPHEN 325 MG PO TABS: 650.0000 mg | ORAL_TABLET | ORAL | Status: DC | PRN

## 2021-02-21 MED ORDER — LIDOCAINE HCL (PF) 1 % IJ SOLN
30.0000 mL | INTRAMUSCULAR | Status: AC | PRN
  Administered 2021-02-21: 30 mL via SUBCUTANEOUS
  Filled 2021-02-21: qty 30

## 2021-02-21 MED ORDER — OXYTOCIN BOLUS FROM INFUSION
333.0000 mL | Freq: Once | INTRAVENOUS | Status: AC
Start: 2021-02-21 — End: 2021-02-21
  Administered 2021-02-21: 333 mL via INTRAVENOUS

## 2021-02-21 MED ORDER — SENNOSIDES-DOCUSATE SODIUM 8.6-50 MG PO TABS
2.0000 | ORAL_TABLET | ORAL | Status: DC
  Administered 2021-02-22 – 2021-02-23 (×2): 2 via ORAL
  Filled 2021-02-21 (×2): qty 2

## 2021-02-21 MED ORDER — DIPHENHYDRAMINE HCL 25 MG PO CAPS: 25.0000 mg | ORAL_CAPSULE | Freq: Four times a day (QID) | ORAL | Status: DC | PRN

## 2021-02-21 MED ORDER — TETANUS-DIPHTH-ACELL PERTUSSIS 5-2.5-18.5 LF-MCG/0.5 IM SUSY: 0.5000 mL | PREFILLED_SYRINGE | Freq: Once | INTRAMUSCULAR | Status: DC

## 2021-02-21 MED ORDER — WITCH HAZEL-GLYCERIN EX PADS: 1.0000 "application " | MEDICATED_PAD | CUTANEOUS | Status: DC | PRN

## 2021-02-21 MED ORDER — OXYCODONE-ACETAMINOPHEN 5-325 MG PO TABS
1.0000 | ORAL_TABLET | ORAL | Status: DC | PRN
Start: 2021-02-21 — End: 2021-02-21

## 2021-02-21 MED ORDER — SIMETHICONE 80 MG PO CHEW: 80.0000 mg | CHEWABLE_TABLET | ORAL | Status: DC | PRN

## 2021-02-21 MED ORDER — OXYTOCIN-SODIUM CHLORIDE 30-0.9 UT/500ML-% IV SOLN
2.5000 [IU]/h | INTRAVENOUS | Status: DC
  Administered 2021-02-21: 2.5 [IU]/h via INTRAVENOUS
  Filled 2021-02-21: qty 500

## 2021-02-21 MED ORDER — ZOLPIDEM TARTRATE 5 MG PO TABS: 5.0000 mg | ORAL_TABLET | Freq: Every evening | ORAL | Status: DC | PRN

## 2021-02-21 MED ORDER — LACTATED RINGERS IV SOLN: 500.0000 mL | INTRAVENOUS | Status: DC | PRN

## 2021-02-21 MED ORDER — TRANEXAMIC ACID-NACL 1000-0.7 MG/100ML-% IV SOLN
INTRAVENOUS | Status: AC
  Filled 2021-02-21: qty 100

## 2021-02-21 MED ORDER — MISOPROSTOL 200 MCG PO TABS
1000.0000 ug | ORAL_TABLET | Freq: Once | ORAL | Status: AC
  Administered 2021-02-21: 1000 ug via RECTAL

## 2021-02-21 MED ORDER — IBUPROFEN 600 MG PO TABS
600.0000 mg | ORAL_TABLET | Freq: Four times a day (QID) | ORAL | Status: DC
  Administered 2021-02-21 – 2021-02-23 (×7): 600 mg via ORAL
  Filled 2021-02-21 (×7): qty 1

## 2021-02-21 MED ORDER — ONDANSETRON HCL 4 MG/2ML IJ SOLN: 4.0000 mg | INTRAMUSCULAR | Status: DC | PRN

## 2021-02-21 MED ORDER — TRANEXAMIC ACID-NACL 1000-0.7 MG/100ML-% IV SOLN
1000.0000 mg | INTRAVENOUS | Status: AC
  Administered 2021-02-21: 1000 mg via INTRAVENOUS

## 2021-02-21 MED ORDER — BENZOCAINE-MENTHOL 20-0.5 % EX AERO
1.0000 "application " | INHALATION_SPRAY | CUTANEOUS | Status: DC | PRN
  Administered 2021-02-21: 1 via TOPICAL
  Filled 2021-02-21: qty 56

## 2021-02-21 MED ORDER — FENTANYL CITRATE (PF) 100 MCG/2ML IJ SOLN: 100.0000 ug | INTRAMUSCULAR | Status: DC | PRN

## 2021-02-21 MED ORDER — DIBUCAINE (PERIANAL) 1 % EX OINT
1.0000 "application " | TOPICAL_OINTMENT | CUTANEOUS | Status: DC | PRN
  Administered 2021-02-21: 1 via RECTAL
  Filled 2021-02-21: qty 28

## 2021-02-21 MED ORDER — PRENATAL MULTIVITAMIN CH
1.0000 | ORAL_TABLET | Freq: Every day | ORAL | Status: DC
  Administered 2021-02-22: 1 via ORAL
  Filled 2021-02-21: qty 1

## 2021-02-21 MED ORDER — LACTATED RINGERS IV SOLN: INTRAVENOUS | Status: DC

## 2021-02-21 MED ORDER — COCONUT OIL OIL
1.0000 "application " | TOPICAL_OIL | Status: DC | PRN
  Administered 2021-02-21: 1 via TOPICAL

## 2021-02-21 MED ORDER — ONDANSETRON HCL 4 MG PO TABS: 4.0000 mg | ORAL_TABLET | ORAL | Status: DC | PRN

## 2021-02-21 MED ORDER — OXYCODONE-ACETAMINOPHEN 5-325 MG PO TABS: 2.0000 | ORAL_TABLET | ORAL | Status: DC | PRN

## 2021-02-21 MED ORDER — MISOPROSTOL 200 MCG PO TABS
ORAL_TABLET | ORAL | Status: AC
  Filled 2021-02-21: qty 5

## 2021-02-21 MED ORDER — SOD CITRATE-CITRIC ACID 500-334 MG/5ML PO SOLN
30.0000 mL | ORAL | Status: DC | PRN
Start: 2021-02-21 — End: 2021-02-21

## 2021-02-21 NOTE — MAU Note (Signed)
Pt presents for contractions, denies bleeding or ROM

## 2021-02-21 NOTE — Discharge Summary (Signed)
Postpartum Discharge Summary      Patient Name: Shelia Delgado DOB: 10-20-1985 MRN: [id number]  Date of admission: 02/21/2021 Delivery date:02/21/2021  Delivering provider: Katherine Basset [facility]  Date of discharge: 02/23/2021  Admitting diagnosis: Amniotic fluid leaking [O42.90] Intrauterine pregnancy: 100w0d    Secondary diagnosis:  Principal Problem:   Normal labor Active Problems:   Supervision of low-risk first pregnancy   Amniotic fluid leaking   Gestational hypertension  Additional problems: None    Discharge diagnosis: Term Pregnancy Delivered and Gestational Hypertension                                              Post partum procedures: None Augmentation: N/A Complications: None  Hospital course: Onset of Labor With Vacuum Assisted Vaginal Delivery      35y.o. yo G1P0000 at 457w0das admitted in Active Labor on 02/21/2021. Patient had an uncomplicated labor course as follows:  Membrane Rupture Time/Date: 10:00 AM ,02/21/2021   Delivery Method:Vaginal, Vacuum (Extractor)  Episiotomy: None  Lacerations:  Sulcus;2nd degree;Perineal  Patient had a postpartum course remarkable for being started on Procardia XL 30  She is ambulating, tolerating a regular diet, passing flatus, and urinating well. Patient is discharged home in stable condition on 02/23/21.  Newborn Data: Birth date:02/21/2021  Birth time:2:31 PM  Gender:Female  Living status:Living  Apgars:8 ,9  Weight:3331 g   Magnesium Sulfate received: No BMZ received: No Rhophylac:N/A MMR:N/A T-DaP:Given prenatally Flu: N/A Transfusion:No  Physical exam  Vitals:   02/22/21 0504 02/22/21 1309 02/22/21 2130 02/23/21 0545  BP: 119/75 125/83 111/72 122/78  Pulse: 80 89 89 88  Resp: '18 18 18 18  ' Temp: 98.2 F (36.8 C) 98.2 F (36.8 C) 97.9 F (36.6 C) 98.2 F (36.8 C)  TempSrc: Oral Oral Oral Oral  SpO2: 98% 98% 97% 98%   General: alert and cooperative Lochia: appropriate Uterine Fundus:  firm Incision: N/A DVT Evaluation: No evidence of DVT seen on physical exam. Labs: Lab Results  Component Value Date   WBC 18.3 (H) 02/21/2021   HGB 12.7 02/21/2021   HCT 39.2 02/21/2021   MCV 91.0 02/21/2021   PLT 300 02/21/2021   CMP Latest Ref Rng & Units 02/21/2021  Glucose 70 - 99 mg/dL 99  BUN 6 - 20 mg/dL 8  Creatinine 0.44 - 1.00 mg/dL 0.66  Sodium 135 - 145 mmol/L 134(L)  Potassium 3.5 - 5.1 mmol/L 3.9  Chloride 98 - 111 mmol/L 103  CO2 22 - 32 mmol/L 18(L)  Calcium 8.9 - 10.3 mg/dL 9.5  Total Protein 6.5 - 8.1 g/dL 6.7  Total Bilirubin 0.3 - 1.2 mg/dL 0.3  Alkaline Phos 38 - 126 U/L 123  AST 15 - 41 U/L 35  ALT 0 - 44 U/L 25   Edinburgh Score: Edinburgh Postnatal Depression Scale Screening Tool 02/21/2021  I have been able to laugh and see the funny side of things. 0  I have looked forward with enjoyment to things. 0  I have blamed myself unnecessarily when things went wrong. 1  I have been anxious or worried for no good reason. 0  I have felt scared or panicky for no good reason. 0  Things have been getting on top of me. 1  I have been so unhappy that I have had difficulty sleeping. 0  I have felt sad or miserable.  0  I have been so unhappy that I have been crying. 0  The thought of harming myself has occurred to me. 0  Edinburgh Postnatal Depression Scale Total 2     After visit meds:  Allergies as of 02/23/2021       Reactions   Amoxicillin Rash   Mild rash after taking for 1 week        Medication List     STOP taking these medications    calcium carbonate 500 MG chewable tablet Commonly known as: TUMS - dosed in mg elemental calcium   cetirizine 10 MG tablet Commonly known as: ZYRTEC   dicyclomine 20 MG tablet Commonly known as: BENTYL   fluticasone 50 MCG/ACT nasal spray Commonly known as: FLONASE   MIRALAX PO   SUDAFED COUGH PO   triamcinolone cream 0.1 % Commonly known as: KENALOG       TAKE these medications    ibuprofen  600 MG tablet Commonly known as: ADVIL Take 1 tablet (600 mg total) by mouth every 6 (six) hours as needed.   NIFEdipine 30 MG 24 hr tablet Commonly known as: ADALAT CC Take 1 tablet (30 mg total) by mouth daily.   prenatal vitamin w/FE, FA 29-1 MG Chew Chew 1 tablet by mouth daily.         Discharge home in stable condition Infant Feeding: Breast Infant Disposition:home with mother Discharge instruction: per After Visit Summary and Postpartum booklet. Activity: Advance as tolerated. Pelvic rest for 6 weeks.  Diet: routine diet Future Appointments: Future Appointments  Date Time Provider [place]  02/28/2021 10:30 AM [facility] NURSE [facility]  12/11/2021  1:20 PM Emeterio Reeve, DO [facility] None    Follow up Visit:  Please schedule this patient for a In person postpartum visit in 1 week & 4 weeks with the following provider: Any provider. Additional Postpartum F/U:BP check 1 week  Low risk pregnancy complicated by:  new onset gestational hypertension on admission. Delivery mode:  Vaginal, Vacuum (Extractor)  Anticipated Birth Control:  Depo   02/23/2021 Myrtis Ser, CNM 9:30 AM

## 2021-02-21 NOTE — H&P (Signed)
Shelia Delgado is a 35 y.o. female presenting for labor.  Patient arrived to MAU with contractions. CTX began at 4AM today. While in MAU her water broke, with thin mec fluid. Also while in MAU, she had newly elevated mild range BP. Denies HA, vision changes, RUQ pain. +FM.  Pregnancy was uncomplicated. Desires to labor and deliver unmedicated.   OB History     Gravida  1   Para  0   Term  0   Preterm  0   AB  0   Living  0      SAB  0   IAB  0   Ectopic  0   Multiple  0   Live Births  0          Past Medical History:  Diagnosis Date   Allergy    Dyshidrotic dermatitis    Overweight    Past Surgical History:  Procedure Laterality Date   WISDOM TOOTH EXTRACTION     Family History: family history includes Atrial fibrillation in her father; Diabetes in her father and maternal grandfather; Healthy in her brother; Heart attack in her paternal grandfather and paternal uncle; Hypertension in her father and mother; Lung disease in her maternal grandfather; Skin cancer in her mother. Social History:  reports that she has never smoked. She has never used smokeless tobacco. She reports current alcohol use. She reports previous drug use. Drug: Marijuana.     Maternal Diabetes: No Genetic Screening: Normal Maternal Ultrasounds/Referrals: Normal Fetal Ultrasounds or other Referrals:  None Maternal Substance Abuse:  No Significant Maternal Medications:  None Significant Maternal Lab Results:  Group B Strep negative and Other: O blood type. Other Comments:  None  Review of Systems History Dilation: 6 Effacement (%): 90 Station: Plus 1 Exam by:: Dr. Omer Jack Blood pressure (!) 147/74, pulse 65, temperature (!) 97.5 F (36.4 C), temperature source Oral, resp. rate 16, last menstrual period 05/17/2020, SpO2 100 %. Maternal Exam:  Abdomen: Fetal presentation: vertex Introitus: Normal vulva. Amniotic fluid character: meconium stained.   Fetal Exam Fetal Monitor Review:  Baseline rate: 120.  Variability: moderate (6-25 bpm).   Pattern: accelerations present and prolonged decelerations.   Fetal State Assessment: Category II - tracings are indeterminate. After presentation to labor and delivery floor, at 11:02AM patient had a prolonged deceleration to the 90s with moderate variability, and returned to a baseline of 120bpm. Good scalp stimulation. She was also noted to be making rapid change, from 3cm/70%/-1 to 6cm/90%/+1.    Physical Exam Constitutional:      General: She is not in acute distress.    Appearance: She is well-developed.  HENT:     Head: Normocephalic and atraumatic.  Cardiovascular:     Rate and Rhythm: Normal rate.  Pulmonary:     Effort: Pulmonary effort is normal. No respiratory distress.  Genitourinary:    General: Normal vulva.  Musculoskeletal:        General: No tenderness.  Skin:    General: Skin is warm and dry.     Findings: No erythema or rash.  Neurological:     Mental Status: She is alert and oriented to person, place, and time.    Prenatal labs: ABO, Rh: --/--/PENDING (08/02 1026) Antibody: PENDING (08/02 1026) Rubella: 1.22 (01/10 1413) RPR: NON-REACTIVE (05/13 0851)  HBsAg: NON-REACTIVE (01/10 1413)  HIV: NON-REACTIVE (05/13 0851)  GBS:   NEG  Assessment/Plan: Shelia Delgado is a 35 y.o. G1P0000 at [redacted]w[redacted]d by LMP c/w 9wk [place].  #  Labor:  Expectant management #Pain: May have epidural if she desires, although states she wants to do this unmedicated #FWB:  Cat II - single prolong decel at 11:02AM with recovery to baseline #ID:  GBS Neg  #MOF: breast #MOC: Depo #GHTN: high suspicion of newly diagnosed GHTN (has not had 4 hours apart mild ranges yet). PreE labs negative (PC ratio 0.14, CBC/CMP normal, no symptoms)     Jen Mow, DO 02/21/2021, 11:28 AM

## 2021-02-22 LAB — RPR: RPR Ser Ql: NONREACTIVE

## 2021-02-22 MED ORDER — NIFEDIPINE ER OSMOTIC RELEASE 30 MG PO TB24
30.0000 mg | ORAL_TABLET | Freq: Every day | ORAL | Status: DC
  Administered 2021-02-22 – 2021-02-23 (×2): 30 mg via ORAL
  Filled 2021-02-22 (×2): qty 1

## 2021-02-22 NOTE — Lactation Note (Signed)
This note was copied from a baby's chart. Lactation Consultation Note  Patient Name: Shelia Delgado [id number] Date: 02/22/2021 Reason for consult: Initial assessment;Mother's request Age:33 hours  Mom had requested for me to return. Lactation visit was cut short b/c of arrival of tech to do hearing screen.    Mom to call for me when she is ready for me to return.  Lurline Hare [facility] 02/22/2021, 9:34 AM

## 2021-02-22 NOTE — Lactation Note (Signed)
This note was copied from a baby's chart. Lactation Consultation Note  Patient Name: Shelia Delgado [id number] Date: 02/22/2021 Reason for consult: Initial assessment;Term;Primapara Age:34 hours  "[name]" recently fed; she began showing additional cues after I entered the room. Mom brought her to breast, but [name] soon fell asleep.  Mom's L nipple is creased from the previous latch. Her skin is still intact, however. Specifics of an asymmetric latch were shown to Mom via [employer] to improve infant's latch.  As infant is now 68 hrs old, Mom knows that feeding frequency will need to increase to 8+ times in 24-hr period. Signs of milk transfer were discussed. Mom's questions were answered to her satisfaction.   Mom has a Spectra pump for home use. On exam, Mom would need size 21 flanges. I provided Mom with a hand pump and a size 21 flange & briefly showed Mom how to use it. At this time, Mom does not need to pump, but I wanted her to have an additional means to express her milk that had the correct size flange.   Lurline Hare [facility] 02/22/2021, 12:55 PM

## 2021-02-22 NOTE — Lactation Note (Signed)
This note was copied from a baby's chart. Lactation Consultation Note  Patient Name: Shelia Delgado [id number] Date: 02/22/2021   Age:34 hours  LC visit attempted; infant sleeping in bassinet. Mom trying to nap. Mom would like to sleep while infant is sleeping. Mom to call for me when she is ready for me to return.    BF brochure left in room. Lurline Hare [facility] 02/22/2021, 8:34 AM

## 2021-02-22 NOTE — Lactation Note (Signed)
This note was copied from a baby's chart. Lactation Consultation Note Attempted to see mom. Mom is resting. Woke up when [facility] came into room.  Mom stated that the baby had BF well w/RN assistance. Mom stated it wasn't time for baby to feed. Left Lactation brochure. Asked mom to call for assistance as needed.  Patient Name: Shelia Delgado [id number] Date: 02/22/2021   Age:34 hours  Maternal Data    Feeding    LATCH Score Latch: Grasps breast easily, tongue down, lips flanged, rhythmical sucking.  Audible Swallowing: A few with stimulation  Type of Nipple: Everted at rest and after stimulation  Comfort (Breast/Nipple): Filling, red/small blisters or bruises, mild/mod discomfort  Hold (Positioning): No assistance needed to correctly position infant at breast.  LATCH Score: 8   Lactation Tools Discussed/Used    Interventions    Discharge    Consult Status      Charyl Dancer 02/22/2021, 4:36 AM

## 2021-02-22 NOTE — Progress Notes (Signed)
Post Partum Day 1 Subjective: no complaints, up ad lib, voiding, tolerating PO, + flatus, and and BM  Objective: Blood pressure 119/75, pulse 80, temperature 98.2 F (36.8 C), temperature source Oral, resp. rate 18, last menstrual period 05/17/2020, SpO2 98 %, unknown if currently breastfeeding.  Physical Exam:  General: alert, cooperative, appears stated age, and no distress Lochia: appropriate Uterine Fundus: firm Incision: n/a DVT Evaluation: No evidence of DVT seen on physical exam. Negative Homan's sign. No cords or calf tenderness. No significant calf/ankle edema.  Recent Labs    02/21/21 1026  HGB 12.7  HCT 39.2    Assessment/Plan: Plan for discharge tomorrow, Breastfeeding, and Contraception depo prior to discharge.  #gHTN: patient has had elevated BP >130/80 x2. Will treat with procardia 30 mg qd with a one week postpartum BP check. Asymptomatic.    LOS: 1 day   Shirlean Mylar 02/22/2021, 8:26 AM   GME ATTESTATION:  I saw and evaluated the patient. I agree with the findings and the plan of care as documented in the resident's note.  Plan on discharge tomorrow.   Leticia Penna, DO OB Fellow, [facility], Center for [facility] 02/22/2021 10:28 AM

## 2021-02-23 ENCOUNTER — Other Ambulatory Visit (HOSPITAL_COMMUNITY): Payer: Self-pay

## 2021-02-23 ENCOUNTER — Other Ambulatory Visit: Payer: 59

## 2021-02-23 MED ORDER — NIFEDIPINE ER 30 MG PO TB24
30.0000 mg | ORAL_TABLET | Freq: Every day | ORAL | 1 refills | Status: DC
  Filled 2021-02-23: qty 30, 30d supply, fill #0

## 2021-02-23 MED ORDER — IBUPROFEN 600 MG PO TABS
600.0000 mg | ORAL_TABLET | Freq: Four times a day (QID) | ORAL | 0 refills | Status: DC | PRN
  Filled 2021-02-23: qty 30, 8d supply, fill #0

## 2021-02-23 NOTE — Lactation Note (Addendum)
This note was copied from a baby's chart. Lactation Consultation Note  Patient Name: Shelia Delgado [id number] Date: 02/23/2021 Reason for consult: Follow-up assessment;Primapara;1st time breastfeeding;Infant weight loss;Term;Nipple pain/trauma;Other (Comment) (as LC entered at 1325 baby feeding and released for seconds and LC assisted to latch with depth / more swallows noted) . LC reviewed the doc flow sheets with  mom.  Age:34 hours LC reviewed BF D/C teaching, - see below.  LC provided shells with instructions.  Mom has the [facility] brochure with resources for BFSG and numbers.   Maternal Data    Feeding Mother's Current Feeding Choice: Breast Milk  LATCH Score Latch:  (depth at the breast improved)  Audible Swallowing:  (more swallows)  Type of Nipple:  (no breakdown - erect - some areola edema)     Hold (Positioning):  (assisted with depth)      Lactation Tools Discussed/Used Tools: Shells;Pump;Flanges Flange Size: 24;21 Breast pump type: Manual Pump Education: Milk Storage  Interventions Interventions: Breast feeding basics reviewed;Assisted with latch;Hand pump;Shells;Education  Discharge Discharge Education: Engorgement and breast care;Warning signs for feeding baby Pump: Personal;Manual;DEBP  Consult Status Consult Status: Complete Date: 02/23/21    Kathrin Greathouse 02/23/2021, 1:46 PM

## 2021-02-26 ENCOUNTER — Encounter (HOSPITAL_COMMUNITY): Payer: Self-pay | Admitting: Emergency Medicine

## 2021-02-26 ENCOUNTER — Emergency Department (HOSPITAL_COMMUNITY)
Admission: EM | Admit: 2021-02-26 | Discharge: 2021-02-26 | Disposition: A | Discharge status: Home / Self Care | Payer: 59 | Attending: Emergency Medicine | Admitting: Emergency Medicine

## 2021-02-26 ENCOUNTER — Other Ambulatory Visit: Payer: Self-pay

## 2021-02-26 DIAGNOSIS — Z79899 Other long term (current) drug therapy: Secondary | ICD-10-CM | POA: Insufficient documentation

## 2021-02-26 DIAGNOSIS — N939 Abnormal uterine and vaginal bleeding, unspecified: Secondary | ICD-10-CM | POA: Diagnosis not present

## 2021-02-26 DIAGNOSIS — Z8759 Personal history of other complications of pregnancy, childbirth and the puerperium: Secondary | ICD-10-CM | POA: Diagnosis not present

## 2021-02-26 NOTE — Discharge Instructions (Addendum)
Please follow up with your OBGYN for outpatient evaluation and management of your pregnancy.  Return if you have any concerns.

## 2021-02-26 NOTE — ED Provider Notes (Signed)
[facility] EMERGENCY DEPARTMENT Provider Note   CSN: [id number] Arrival date & time: 02/26/21  1057     History No chief complaint on file.   Shelia Delgado is a 35 y.o. female.  The history is provided by the patient and medical records. No language interpreter was used.    35 year old female 5 days post partum presenting for evaluation of vaginal bleeding.  Pt had a normal vaginal delivery of her first child at [redacted]w[redacted]d.  She had an uncomplicated labor and subsequently discharge home.  She was also started on procardia XL30.  Pt report this AM when she was urinating she passed a large clot the size of a golf ball. She did reached out to her OBGYN and was instructed to rest and f/u outpt with her OBGYN.  However, pt and husband voice concerns for potential PE/MI/Stroke risk after seeing the clot, thus prompting this ER visit.  She denies having any significant abdominal pain, no fever, chest pain, shortness of breath, focal weakness or numbness.  No prior hx of PE or DVT. No report of leg swelling or calf pain.    Past Medical History:  Diagnosis Date   Allergy    Dyshidrotic dermatitis    Medical history non-contributory    Overweight     Patient Active Problem List   Diagnosis Date Noted   Amniotic fluid leaking 02/21/2021   Normal labor 02/21/2021   Gestational hypertension 02/21/2021   Supervision of low-risk first pregnancy 08/01/2020   BMI 33.0-33.9,adult 01/26/2019   Dyshidrotic dermatitis     Past Surgical History:  Procedure Laterality Date   NO PAST SURGERIES     WISDOM TOOTH EXTRACTION       OB History     Gravida  1   Para  1   Term  1   Preterm  0   AB  0   Living  1      SAB  0   IAB  0   Ectopic  0   Multiple  0   Live Births  1           Family History  Problem Relation Age of Onset   Hypertension Mother    Skin cancer Mother    Hypertension Father    Atrial fibrillation Father    Diabetes Father     Diabetes Maternal Grandfather    Lung disease Maternal Grandfather    Heart attack Paternal Grandfather    Heart attack Paternal Uncle    Healthy Brother     Social History   Tobacco Use   Smoking status: Never   Smokeless tobacco: Never  Vaping Use   Vaping Use: Never used  Substance Use Topics   Alcohol use: Yes    Comment: occ   Drug use: Not Currently    Types: Marijuana    Home Medications Prior to Admission medications   Medication Sig Start Date End Date Taking? Authorizing Provider  ibuprofen (ADVIL) 600 MG tablet Take 1 tablet (600 mg total) by mouth every 6 (six) hours as needed. 02/23/21   Arabella Merles, CNM  NIFEdipine (ADALAT CC) 30 MG 24 hr tablet Take 1 tablet (30 mg total) by mouth daily. 02/23/21   Arabella Merles, CNM  Prenatal w/o A Vit-Fe Fum-FA (PRENATAL VITAMIN W/FE, FA) 29-1 MG CHEW Chew 1 tablet by mouth daily. 12/04/19   Sunnie Nielsen, DO    Allergies    Amoxicillin  Review of Systems  Review of Systems  All other systems reviewed and are negative.  Physical Exam Updated Vital Signs BP 118/82 (BP Location: Left Arm)   Pulse 99   Temp 98 F (36.7 C)   Resp 15   LMP 05/17/2020   SpO2 99%   Physical Exam Vitals and nursing note reviewed.  Constitutional:      General: She is not in acute distress.    Appearance: She is well-developed.  HENT:     Head: Atraumatic.  Eyes:     Conjunctiva/sclera: Conjunctivae normal.  Cardiovascular:     Rate and Rhythm: Normal rate and regular rhythm.     Pulses: Normal pulses.     Heart sounds: Normal heart sounds.  Pulmonary:     Effort: Pulmonary effort is normal.  Abdominal:     General: Bowel sounds are normal.     Palpations: Abdomen is soft.     Tenderness: There is no abdominal tenderness.  Musculoskeletal:     Cervical back: Neck supple.     Right lower leg: No edema.     Left lower leg: No edema.  Skin:    Findings: No rash.  Neurological:     Mental Status: She is alert.  Mental status is at baseline.  Psychiatric:        Mood and Affect: Mood normal.    ED Results / Procedures / Treatments   Labs (all labs ordered are listed, but only abnormal results are displayed) Labs Reviewed - No data to display  EKG None  Radiology No results found.  Procedures Procedures   Medications Ordered in ED Medications - No data to display  ED Course  I have reviewed the triage vital signs and the nursing notes.  Pertinent labs & imaging results that were available during my care of the patient were reviewed by me and considered in my medical decision making (see chart for details).    MDM Rules/Calculators/A&P                           BP 118/82 (BP Location: Left Arm)   Pulse 99   Temp 98 F (36.7 C)   Resp 15   LMP 05/17/2020   SpO2 99%   Final Clinical Impression(s) / ED Diagnoses Final diagnoses:  Vagina bleeding    Rx / DC Orders ED Discharge Orders     None      11:18 AM Pt day 5 post partum who noted passing one golf size blood clot this AM and was worrying about potential PE.  Pt however without sxs concerning for PE.  Reassurance given.  Return precaution given. Pelvic exam deferred.     Fayrene Helper, PA-C 02/26/21 1121    Linwood Dibbles, MD 02/27/21 [phone]

## 2021-02-26 NOTE — ED Notes (Signed)
Discharged from triage by PA. 

## 2021-02-26 NOTE — ED Triage Notes (Signed)
Pt had vaginal delivery on Tuesday.  Reports golf ball size blood clot today.  States she called OB on-call and was told to wait to see OB in the morning but she is concerned about other blood clots throughout body.

## 2021-02-28 ENCOUNTER — Ambulatory Visit (INDEPENDENT_AMBULATORY_CARE_PROVIDER_SITE_OTHER): Payer: 59 | Admitting: Advanced Practice Midwife

## 2021-02-28 ENCOUNTER — Encounter: Payer: Self-pay | Admitting: Advanced Practice Midwife

## 2021-02-28 ENCOUNTER — Other Ambulatory Visit: Payer: Self-pay

## 2021-02-28 VITALS — BP 124/74 | HR 90 | Resp 16 | Ht 64.0 in | Wt 198.0 lb

## 2021-02-28 DIAGNOSIS — O139 Gestational [pregnancy-induced] hypertension without significant proteinuria, unspecified trimester: Secondary | ICD-10-CM | POA: Diagnosis not present

## 2021-02-28 DIAGNOSIS — Z3009 Encounter for other general counseling and advice on contraception: Secondary | ICD-10-CM | POA: Diagnosis not present

## 2021-02-28 MED ORDER — MEDROXYPROGESTERONE ACETATE 150 MG/ML IM SUSP
150.0000 mg | Freq: Once | INTRAMUSCULAR | Status: AC
  Administered 2021-02-28: 150 mg via INTRAMUSCULAR

## 2021-02-28 MED ORDER — MEDROXYPROGESTERONE ACETATE 150 MG/ML IM SUSP: 150.0000 mg | INTRAMUSCULAR | 4 refills | Status: DC

## 2021-02-28 NOTE — Progress Notes (Signed)
   GYNECOLOGY PROGRESS NOTE  History:  35 y.o. G1P1001 presents to [facility] office today for 8 day postpartum visit for BP check and to discuss bleeding. She reports normal PP course except for one episode of bleeding with large clot x 1 on 02/26/21. She presented to [facility] and was triaged and discharged with reassurance and close f/u in the office. She reports light bleeding since this episode and denies any pain or other associated symptoms. She denies s/sx of anemia.   She is taking Procardia 30 XL daily as prescribed at hospital discharge and denies any s/sx of PEC.  The following portions of the patient's history were reviewed and updated as appropriate: allergies, current medications, past family history, past medical history, past social history, past surgical history and problem list. Last pap smear on 12/04/2018 was normal, negative HRHPV.  Health Maintenance Due  Topic Date Due   COVID-19 Vaccine (1) Never done   INFLUENZA VACCINE  02/20/2021     Review of Systems:  Pertinent items are noted in HPI.   Objective:  Physical Exam Blood pressure 124/74, pulse 90, resp. rate 16, height 5\' 4"  (1.626 m), weight 198 lb (89.8 kg), last menstrual period 05/17/2020, currently breastfeeding. VS reviewed, nursing note reviewed,  Constitutional: well developed, well nourished, no distress HEENT: normocephalic CV: normal rate Pulm/chest wall: normal effort Breast Exam: deferred Abdomen: soft Neuro: alert and oriented x 3 Skin: warm, dry Psych: affect normal Pelvic exam: Cervix pink, visually closed, without lesion, scant white creamy discharge, vaginal walls and external genitalia normal Bimanual exam: Cervix 0/long/high, firm, anterior, neg CMT, uterus nontender, nonenlarged, adnexa without tenderness, enlargement, or mass  Assessment & Plan:  1. Encounter for counseling regarding contraception --Discussed LARCs as most effective forms of birth control.  Discussed benefits/risks of  other methods.  Pt desires Depo Provera.  Discussed longer time to reverse Depo/become pregnant for 1 year with Depo, not as reversible as LARCs.  Pt plans Depo today, will consider changing in the future as desired.     --Depo injection given in office, Rx for future injections   2. Postpartum care following vaginal delivery --Pt scheduled for BP check but reported an episode of bleeding with golf ball sized clot x 1 on 02/26/21. She was triaged in the [facility] and discharged with reassurance. --Bleeding has been minimal since 8/7 with no additional large clots --She denies s/sx of anemia  3. Gestational hypertension, antepartum --BP wnl today, continue Procardia, Rx renewed --Reevaluate medication at [facility] visit   [facility], CNM 11:19 AM

## 2021-02-28 NOTE — Progress Notes (Signed)
Epo Provera start given to patient from office stock

## 2021-03-01 ENCOUNTER — Ambulatory Visit: Payer: 59

## 2021-03-06 ENCOUNTER — Other Ambulatory Visit: Payer: Self-pay

## 2021-03-06 MED ORDER — NIFEDIPINE ER 30 MG PO TB24: 30.0000 mg | ORAL_TABLET | Freq: Every day | ORAL | 1 refills | Status: DC

## 2021-03-06 MED ORDER — IBUPROFEN 600 MG PO TABS: 600.0000 mg | ORAL_TABLET | Freq: Four times a day (QID) | ORAL | 0 refills | Status: DC | PRN

## 2021-03-06 NOTE — Telephone Encounter (Signed)
Patient called stating that ibuprofen and BP medication did not get sent to the correct pharmacy.  Patient request rx go to [facility] [address] [place]

## 2021-03-21 ENCOUNTER — Ambulatory Visit (INDEPENDENT_AMBULATORY_CARE_PROVIDER_SITE_OTHER): Payer: 59 | Admitting: Advanced Practice Midwife

## 2021-03-21 ENCOUNTER — Encounter: Payer: Self-pay | Admitting: Advanced Practice Midwife

## 2021-03-21 ENCOUNTER — Other Ambulatory Visit: Payer: Self-pay

## 2021-03-21 VITALS — BP 103/64 | HR 75 | Ht 66.0 in | Wt 189.0 lb

## 2021-03-21 DIAGNOSIS — Z8759 Personal history of other complications of pregnancy, childbirth and the puerperium: Secondary | ICD-10-CM

## 2021-03-21 DIAGNOSIS — Z3009 Encounter for other general counseling and advice on contraception: Secondary | ICD-10-CM

## 2021-03-21 DIAGNOSIS — M549 Dorsalgia, unspecified: Secondary | ICD-10-CM

## 2021-03-21 DIAGNOSIS — R0781 Pleurodynia: Secondary | ICD-10-CM

## 2021-03-21 MED ORDER — NORETHINDRONE 0.35 MG PO TABS: 1.0000 | ORAL_TABLET | Freq: Every day | ORAL | 11 refills | Status: DC

## 2021-03-21 NOTE — Progress Notes (Signed)
Post Partum Visit Note  Shelia Delgado is a 35 y.o. G55P1001 female who presents for a postpartum visit. She is 4 weeks postpartum following a operative vaginal delivery/vacuum with a 2nd degree perineal laceration. I have fully reviewed the prenatal and intrapartum course. The delivery was at 40 gestational weeks.  Anesthesia: none. Postpartum course has been unremarkable. Baby is doing well. Baby is feeding by breast. Bleeding no bleeding. Bowel function is normal. Bladder function is normal. Patient is not sexually active. Contraception method is Depo-Provera injections but in considering switching to POPs. Postpartum depression screening: negative.  Lat pap 2020.   The pregnancy intention screening data noted above was reviewed. Potential methods of contraception were discussed. The patient elected to proceed with No data recorded.   Edinburgh Postnatal Depression Scale - 03/21/21 1443       Edinburgh Postnatal Depression Scale:  In the Past 7 Days   I have been able to laugh and see the funny side of things. 0    I have looked forward with enjoyment to things. 0    I have blamed myself unnecessarily when things went wrong. 0    I have been anxious or worried for no good reason. 0    I have felt scared or panicky for no good reason. 0    Things have been getting on top of me. 1    I have been so unhappy that I have had difficulty sleeping. 0    I have felt sad or miserable. 0    I have been so unhappy that I have been crying. 0    The thought of harming myself has occurred to me. 0    Edinburgh Postnatal Depression Scale Total 1             Health Maintenance Due  Topic Date Due   COVID-19 Vaccine (1) Never done   INFLUENZA VACCINE  02/20/2021    The following portions of the patient's history were reviewed and updated as appropriate: allergies, current medications, past family history, past medical history, past social history, past surgical history, and problem  list.  Review of Systems Pertinent items noted in HPI and remainder of comprehensive ROS otherwise negative.  Objective:  BP 103/64   Pulse 75   Ht 5\' 6"  (1.676 m)   Wt 189 lb (85.7 kg)   LMP 05/17/2020   Breastfeeding Yes   BMI 30.51 kg/m    VS reviewed, nursing note reviewed,  Constitutional: well developed, well nourished, no distress HEENT: normocephalic CV: normal rate Pulm/chest wall: normal effort Breast Exam: Deferred Abdomen: soft Neuro: alert and oriented x 3 Skin: warm, dry Psych: affect normal Pelvic exam: On visual inspection, erythema of buttocks noted, perineum well approximated, tiny amount of suture visible at perineum, no edema, erythema, or exudate at laceration/repair site  Assessment:   1. Encounter for counseling regarding contraception --Pt wants to stop Depo, does not want to do LARC at this time. Is breastfeeding, wants to stay with progesterone only options.  Discussed failure rates of OCPs/POPs with regular use.   - norethindrone (MICRONOR) 0.35 MG tablet; Take 1 tablet (0.35 mg total) by mouth daily.  Dispense: 28 tablet; Refill: 11    2. Upper back pain --Persists since delivery, refer to PT, see below  3. Rib pain on left side --persistent since late pregnancy -Pt to f/u with PT in [place], information given  4. Postpartum care following vaginal delivery --Doing well, bonding with infant, good  support at home  Plan:   Essential components of care per ACOG recommendations:  1.  Mood and well being: Patient with negative depression screening today. Reviewed local resources for support.  - Patient tobacco use? No.   - hx of drug use? No.    2. Infant care and feeding:  -Patient currently breastmilk feeding? Yes. Discussed returning to work and pumping. Reviewed importance of draining breast regularly to support lactation.  -Social determinants of health (SDOH) reviewed in EPIC. No concerns  3. Sexuality, contraception and birth  spacing - Patient does not want a pregnancy in the next year.   - Reviewed forms of contraception in tiered fashion. Patient desired oral progesterone-only contraceptive today.   - Discussed birth spacing of 18 months  4. Sleep and fatigue -Encouraged family/partner/community support of 4 hrs of uninterrupted sleep to help with mood and fatigue  5. Physical Recovery  - Discussed patients delivery and complications. She describes her labor as good. - Patient had a Vaginal, no problems at delivery. Patient had a 2nd degree laceration. Perineal healing reviewed. Patient expressed understanding - Patient has urinary incontinence? No. - Patient is safe to resume physical and sexual activity  6.  Health Maintenance - HM due items addressed Yes - Last pap smear  Diagnosis  Date Value Ref Range Status  12/04/2018   Final   NEGATIVE FOR INTRAEPITHELIAL LESIONS OR MALIGNANCY.   Pap smear not done at today's visit.  -Breast Cancer screening indicated? No.   7. Chronic Disease/Pregnancy Condition follow up: None and Hypertension  - PCP follow up  Sharen Counter, CNM Center for [employer], [facility]

## 2021-03-26 ENCOUNTER — Other Ambulatory Visit: Payer: Self-pay | Admitting: Advanced Practice Midwife

## 2021-03-26 DIAGNOSIS — K5900 Constipation, unspecified: Secondary | ICD-10-CM

## 2021-03-26 MED ORDER — DOCUSATE SODIUM 100 MG PO CAPS: 100.0000 mg | ORAL_CAPSULE | Freq: Two times a day (BID) | ORAL | 0 refills | Status: DC

## 2021-03-26 MED ORDER — POLYETHYLENE GLYCOL 3350 17 GM/SCOOP PO POWD: 17.0000 g | Freq: Every day | ORAL | 0 refills | Status: DC

## 2021-03-29 ENCOUNTER — Telehealth: Payer: Self-pay | Admitting: *Deleted

## 2021-03-29 MED ORDER — POLYETHYLENE GLYCOL 3350 17 GM/SCOOP PO POWD: ORAL | 1 refills | Status: DC

## 2021-03-29 NOTE — Telephone Encounter (Signed)
Pt called requesting that her Miralax be sent in as a larger bottle since she uses it so frequently.  850 gm bottle sent to pharmacy.

## 2021-03-31 ENCOUNTER — Other Ambulatory Visit (HOSPITAL_COMMUNITY): Payer: Self-pay

## 2021-04-04 ENCOUNTER — Other Ambulatory Visit (HOSPITAL_COMMUNITY): Payer: Self-pay

## 2021-04-05 ENCOUNTER — Other Ambulatory Visit: Payer: Self-pay | Admitting: Obstetrics & Gynecology

## 2021-04-05 NOTE — Progress Notes (Signed)
Beta hcg for bleeding >6 weeks pp.

## 2021-04-06 ENCOUNTER — Other Ambulatory Visit: Payer: 59

## 2021-04-06 ENCOUNTER — Other Ambulatory Visit: Payer: Self-pay

## 2021-04-06 DIAGNOSIS — N939 Abnormal uterine and vaginal bleeding, unspecified: Secondary | ICD-10-CM

## 2021-04-06 LAB — HCG, QUANTITATIVE, PREGNANCY: HCG, Total, QN: <3 m[IU]/mL

## 2021-04-06 NOTE — Progress Notes (Signed)
Pt here for BHCG only due to continued bleeding > 6 weeks PP per Dr Penne Lash.

## 2021-04-10 ENCOUNTER — Other Ambulatory Visit: Payer: Self-pay | Admitting: *Deleted

## 2021-04-10 DIAGNOSIS — N939 Abnormal uterine and vaginal bleeding, unspecified: Secondary | ICD-10-CM

## 2021-04-10 NOTE — Progress Notes (Signed)
Order placed for US pelvic complete with TVU per Dr Penne Lash due to abnormal uterine bleeding.

## 2021-04-11 ENCOUNTER — Other Ambulatory Visit: Payer: Self-pay

## 2021-04-11 ENCOUNTER — Ambulatory Visit (INDEPENDENT_AMBULATORY_CARE_PROVIDER_SITE_OTHER): Payer: 59

## 2021-04-11 DIAGNOSIS — N939 Abnormal uterine and vaginal bleeding, unspecified: Secondary | ICD-10-CM | POA: Diagnosis not present

## 2021-04-25 ENCOUNTER — Other Ambulatory Visit (HOSPITAL_COMMUNITY): Payer: Self-pay

## 2021-04-27 ENCOUNTER — Ambulatory Visit: Payer: 59 | Admitting: Obstetrics and Gynecology

## 2021-04-27 ENCOUNTER — Other Ambulatory Visit (HOSPITAL_COMMUNITY)
Admission: RE | Admit: 2021-04-27 | Discharge: 2021-04-27 | Disposition: A | Discharge status: Home / Self Care | Payer: 59 | Source: Ambulatory Visit | Attending: Obstetrics and Gynecology | Admitting: Obstetrics and Gynecology

## 2021-04-27 ENCOUNTER — Encounter: Payer: Self-pay | Admitting: Obstetrics and Gynecology

## 2021-04-27 ENCOUNTER — Other Ambulatory Visit: Payer: Self-pay

## 2021-04-27 VITALS — BP 116/72 | HR 75 | Resp 16 | Ht 66.0 in | Wt 186.0 lb

## 2021-04-27 DIAGNOSIS — N898 Other specified noninflammatory disorders of vagina: Secondary | ICD-10-CM

## 2021-04-27 MED ORDER — NYSTATIN-TRIAMCINOLONE 100000-0.1 UNIT/GM-% EX OINT: 1.0000 "application " | TOPICAL_OINTMENT | Freq: Two times a day (BID) | CUTANEOUS | 0 refills | Status: DC

## 2021-04-27 MED ORDER — NYSTATIN-TRIAMCINOLONE 100000-0.1 UNIT/GM-% EX OINT: TOPICAL_OINTMENT | Freq: Two times a day (BID) | CUTANEOUS | Status: DC

## 2021-04-27 NOTE — Progress Notes (Signed)
GYNECOLOGY OFFICE VISIT NOTE  History:   Shelia Delgado is a 35 y.o. G1P1001 here today for vaginal odor and discharge but the primary symptom is external itching but within the labia, primarily the labia minora. She does shave but has done this since before she had her daughter and never had this issue.   She is nursing - her daughter is 57 months old.   She denies any abnormal bleeding, pelvic pain or other concerns.     Past Medical History:  Diagnosis Date   Allergy    Dyshidrotic dermatitis    Medical history non-contributory    Overweight     Past Surgical History:  Procedure Laterality Date   NO PAST SURGERIES     WISDOM TOOTH EXTRACTION      The following portions of the patient's history were reviewed and updated as appropriate: allergies, current medications, past family history, past medical history, past social history, past surgical history and problem list.   Health Maintenance:  Normal pap and negative HRHPV on 11/2018.    Review of Systems:  Pertinent items noted in HPI and remainder of comprehensive ROS otherwise negative.  Physical Exam:  BP 116/72   Pulse 75   Resp 16   Ht 5\' 6"  (1.676 m)   Wt 186 lb (84.4 kg)   LMP 05/17/2020   BMI 30.02 kg/m  CONSTITUTIONAL: Well-developed, well-nourished female in no acute distress.  HEENT:  Normocephalic, atraumatic. External right and left ear normal. No scleral icterus.  NECK: Normal range of motion, supple, no masses noted on observation SKIN: No rash noted. Not diaphoretic. No erythema. No pallor. MUSCULOSKELETAL: Normal range of motion. No edema noted. NEUROLOGIC: Alert and oriented to person, place, and time. Normal muscle tone coordination. No cranial nerve deficit noted. PSYCHIATRIC: Normal mood and affect. Normal behavior. Normal judgment and thought content.  CARDIOVASCULAR: Normal heart rate noted RESPIRATORY: Effort and breath sounds normal, no problems with respiration noted ABDOMEN: No masses  noted. No other overt distention noted.    PELVIC: Normal appearing external genitalia except labia minora were red and swollen; normal urethral meatus; normal appearing vaginal mucosa and cervix.  No abnormal discharge noted.  Normal uterine size, no other palpable masses, no uterine or adnexal tenderness. Performed in the presence of a chaperone  Labs and Imaging No results found for this or any previous visit (from the past 168 hour(s)). 05/19/2020 PELVIC COMPLETE WITH TRANSVAGINAL  Result Date: 04/11/2021 CLINICAL DATA:  Abnormal uterine bleeding, had a vaginal delivery 1 month ago EXAM: TRANSABDOMINAL AND TRANSVAGINAL ULTRASOUND OF PELVIS TECHNIQUE: Both transabdominal and transvaginal ultrasound examinations of the pelvis were performed. Transabdominal technique was performed for global imaging of the pelvis including uterus, ovaries, adnexal regions, and pelvic cul-de-sac. It was necessary to proceed with endovaginal exam following the transabdominal exam to visualize the endometrium and adnexa. COMPARISON:  None FINDINGS: Uterus Measurements: 8.6 x 3.7 x 5.4 cm = volume: 89 mL. Anteverted. Normal morphology without mass Endometrium Thickness: 2 mm. Few tiny nonspecific echogenic foci are seen. No mass or fluid identified. Right ovary Measurements: 2.3 x 1.2 x 1.7 cm = volume: 2 mL. Normal morphology without mass Left ovary Measurements: 2.7 x 1.5 x 2.1 cm = volume: 4 mL. Normal morphology without mass Other findings No free pelvic fluid.  No adnexal masses. IMPRESSION: No definite pelvic sonographic abnormalities. Electronically Signed   By: 04/13/2021 M.D.   On: 04/11/2021 14:58      Assessment and Plan:  1. Vaginal odor - Cultures done - will empirically start nystatin/triamcinolone ointment for symptomatic relief and potential therapy if yeast based  - Cervicovaginal ancillary only( [place]) - nystatin-triamcinolone ointment (MYCOLOG); Apply 1 application topically 2 (two) times daily.   Dispense: 30 g; Refill: 0   Routine preventative health maintenance measures emphasized. Please refer to After Visit Summary for other counseling recommendations.   No follow-ups on file.      Milas Hock, MD, FACOG Obstetrician & Gynecologist, [facility] for [facility], [facility]

## 2021-05-01 LAB — CERVICOVAGINAL ANCILLARY ONLY
Bacterial Vaginitis (gardnerella): NEGATIVE
Candida Glabrata: NEGATIVE
Candida Vaginitis: NEGATIVE
Comment: NEGATIVE
Comment: NEGATIVE
Comment: NEGATIVE

## 2021-05-17 ENCOUNTER — Ambulatory Visit: Payer: 59

## 2021-06-18 ENCOUNTER — Other Ambulatory Visit: Payer: Self-pay

## 2021-06-18 ENCOUNTER — Encounter: Payer: Self-pay | Admitting: Emergency Medicine

## 2021-06-18 ENCOUNTER — Emergency Department
Admission: EM | Admit: 2021-06-18 | Discharge: 2021-06-18 | Disposition: A | Discharge status: Home / Self Care | Payer: 59 | Source: Home / Self Care | Attending: Family Medicine | Admitting: Family Medicine

## 2021-06-18 DIAGNOSIS — J020 Streptococcal pharyngitis: Secondary | ICD-10-CM | POA: Diagnosis not present

## 2021-06-18 LAB — POCT RAPID STREP A (OFFICE): Rapid Strep A Screen: POSITIVE — AB

## 2021-06-18 MED ORDER — CEPHALEXIN 500 MG PO CAPS: 500.0000 mg | ORAL_CAPSULE | Freq: Two times a day (BID) | ORAL | 0 refills | Status: DC

## 2021-06-18 NOTE — ED Triage Notes (Addendum)
Sore throat /w/ temp of 99.9 approx 9 days ago Cold symptoms started a few days afterwards  Pt is breast feeding and is unsure of what she  can take  Took thera-flu at 0530 Also has concerns for tonsilar stones Works in preschool - flu & strep have been going around per pt  No COVID or flu vaccine  COVID 2020

## 2021-06-18 NOTE — Discharge Instructions (Signed)
Take the cephalexin 2 times a day Take 2 doses today Is important to take 10 full days of antibiotics May take Tylenol for pain.  Salt water gargles.  Sore throat sprays.

## 2021-06-21 NOTE — ED Provider Notes (Signed)
[facility]    CSN: [id number] Arrival date & time: 06/18/21  5009      History   Chief Complaint Chief Complaint  Patient presents with   Nasal Congestion   Sore Throat    HPI Shelia Delgado is a 35 y.o. female.   HPI Patient works at a preschool.  She is exposed to a lot of illness.  She has had a sore throat and low-grade temperature off-and-on for the last week and a half.  She has not had COVID or flu vaccines.  She has an infant at home and is breast-feeding.  Wonders what she can take for her illness  Past Medical History:  Diagnosis Date   Allergy    COVID-19 2020   no vaccine   Dyshidrotic dermatitis    Medical history non-contributory    Overweight     Patient Active Problem List   Diagnosis Date Noted   History of gestational hypertension 03/21/2021   BMI 33.0-33.9,adult 01/26/2019   Dyshidrotic dermatitis     Past Surgical History:  Procedure Laterality Date   NO PAST SURGERIES     WISDOM TOOTH EXTRACTION      OB History     Gravida  1   Para  1   Term  1   Preterm  0   AB  0   Living  1      SAB  0   IAB  0   Ectopic  0   Multiple  0   Live Births  1            Home Medications    Prior to Admission medications   Medication Sig Start Date End Date Taking? Authorizing Provider  cephALEXin (KEFLEX) 500 MG capsule Take 1 capsule (500 mg total) by mouth 2 (two) times daily. 06/18/21  Yes Eustace Moore, MD  norethindrone (MICRONOR) 0.35 MG tablet Take 1 tablet by mouth daily. 05/25/21   [provider]  polyethylene glycol powder (GLYCOLAX/MIRALAX) 17 GM/SCOOP powder Pt to use 1 scoop daily 03/29/21   Leftwich-Kirby, Wilmer Floor, CNM  Prenatal w/o A Vit-Fe Fum-FA (PRENATAL VITAMIN W/FE, FA) 29-1 MG CHEW Chew 1 tablet by mouth daily. 12/04/19   Sunnie Nielsen, DO    Family History Family History  Problem Relation Age of Onset   Hypertension Mother    Skin cancer Mother    Hypertension Father     Atrial fibrillation Father    Diabetes Father    Diabetes Maternal Grandfather    Lung disease Maternal Grandfather    Heart attack Paternal Grandfather    Heart attack Paternal Uncle    Healthy Brother     Social History Social History   Tobacco Use   Smoking status: Never    Passive exposure: Never   Smokeless tobacco: Never  Vaping Use   Vaping Use: Never used  Substance Use Topics   Alcohol use: Yes    Comment: occ   Drug use: Not Currently    Types: Marijuana     Allergies   Amoxicillin   Review of Systems Review of Systems See HPI  Physical Exam Triage Vital Signs ED Triage Vitals  Enc Vitals Group     BP 06/18/21 0849 102/70     Pulse Rate 06/18/21 0849 75     Resp 06/18/21 0849 15     Temp 06/18/21 0849 98.9 F (37.2 C)     Temp Source 06/18/21 0849 Oral  SpO2 06/18/21 0849 98 %     Weight 06/18/21 0850 185 lb (83.9 kg)     Height 06/18/21 0850 5\' 4"  (1.626 m)     Head Circumference --      Peak Flow --      Pain Score 06/18/21 0850 4     Pain Loc --      Pain Edu? --      Excl. in GC? --    No data found.  Updated Vital Signs BP 102/70 (BP Location: Left Arm)   Pulse 75   Temp 98.9 F (37.2 C) (Oral)   Resp 15   Ht 5\' 4"  (1.626 m)   Wt 83.9 kg   SpO2 98%   Breastfeeding Yes   BMI 31.76 kg/m     Physical Exam Constitutional:      General: She is not in acute distress.    Appearance: She is well-developed.  HENT:     Head: Normocephalic and atraumatic.     Right Ear: Tympanic membrane and ear canal normal.     Left Ear: Tympanic membrane and ear canal normal.     Nose: Nose normal. No congestion.     Mouth/Throat:     Mouth: Mucous membranes are moist.     Pharynx: Posterior oropharyngeal erythema present.     Comments: Tonsils mildly enlarged.  Erythematous.  No exudate but there are tonsil stones apparent Eyes:     Conjunctiva/sclera: Conjunctivae normal.     Pupils: Pupils are equal, round, and reactive to light.   Cardiovascular:     Rate and Rhythm: Normal rate and regular rhythm.     Heart sounds: Normal heart sounds.  Pulmonary:     Effort: Pulmonary effort is normal. No respiratory distress.     Breath sounds: Normal breath sounds.  Abdominal:     General: There is no distension.     Palpations: Abdomen is soft.  Musculoskeletal:        General: Normal range of motion.     Cervical back: Normal range of motion.  Lymphadenopathy:     Cervical: Cervical adenopathy present.  Skin:    General: Skin is warm and dry.  Neurological:     Mental Status: She is alert.  Psychiatric:        Mood and Affect: Mood normal.        Behavior: Behavior normal.     UC Treatments / Results  Labs (all labs ordered are listed, but only abnormal results are displayed) Labs Reviewed  POCT RAPID STREP A (OFFICE) - Abnormal; Notable for the following components:      Result Value   Rapid Strep A Screen Positive (*)    All other components within normal limits    EKG   Radiology No results found.  Procedures Procedures (including critical care time)  Medications Ordered in UC Medications - No data to display  Initial Impression / Assessment and Plan / UC Course  I have reviewed the triage vital signs and the nursing notes.  Pertinent labs & imaging results that were available during my care of the patient were reviewed by me and considered in my medical decision making (see chart for details).     Diagnosis of strep discussed.  Patient is allergic to amoxicillin Developed rash As an adult.  We will treat with Keflex Final Clinical Impressions(s) / UC Diagnoses   Final diagnoses:  Strep pharyngitis     Discharge Instructions  Take the cephalexin 2 times a day Take 2 doses today Is important to take 10 full days of antibiotics May take Tylenol for pain.  Salt water gargles.  Sore throat sprays.   ED Prescriptions     Medication Sig Dispense Auth. Provider   cephALEXin  (KEFLEX) 500 MG capsule Take 1 capsule (500 mg total) by mouth 2 (two) times daily. 20 capsule Eustace Moore, MD      PDMP not reviewed this encounter.   Eustace Moore, MD 06/21/21 [phone]

## 2021-06-29 ENCOUNTER — Encounter: Payer: Self-pay | Admitting: Obstetrics and Gynecology

## 2021-06-30 ENCOUNTER — Telehealth: Payer: Self-pay | Admitting: *Deleted

## 2021-06-30 MED ORDER — NORETHINDRONE 0.35 MG PO TABS: 1.0000 | ORAL_TABLET | Freq: Every day | ORAL | 12 refills | Status: DC

## 2021-06-30 NOTE — Telephone Encounter (Signed)
Rf on [facility]ron[facility] sent to [facility] in [place].

## 2021-07-04 ENCOUNTER — Emergency Department
Admission: EM | Admit: 2021-07-04 | Discharge: 2021-07-04 | Disposition: A | Discharge status: Home / Self Care | Payer: 59 | Source: Home / Self Care

## 2021-07-04 ENCOUNTER — Encounter: Payer: Self-pay | Admitting: Emergency Medicine

## 2021-07-04 DIAGNOSIS — J029 Acute pharyngitis, unspecified: Secondary | ICD-10-CM | POA: Diagnosis not present

## 2021-07-04 DIAGNOSIS — J309 Allergic rhinitis, unspecified: Secondary | ICD-10-CM

## 2021-07-04 DIAGNOSIS — R059 Cough, unspecified: Secondary | ICD-10-CM

## 2021-07-04 LAB — POCT INFLUENZA A/B
Influenza A, POC: NEGATIVE
Influenza B, POC: NEGATIVE

## 2021-07-04 LAB — POCT RAPID STREP A (OFFICE): Rapid Strep A Screen: NEGATIVE

## 2021-07-04 MED ORDER — PROMETHAZINE-DM 6.25-15 MG/5ML PO SYRP: 5.0000 mL | ORAL_SOLUTION | Freq: Two times a day (BID) | ORAL | 0 refills | Status: DC | PRN

## 2021-07-04 MED ORDER — PREDNISONE 20 MG PO TABS: ORAL_TABLET | ORAL | 0 refills | Status: DC

## 2021-07-04 MED ORDER — FEXOFENADINE HCL 180 MG PO TABS: 180.0000 mg | ORAL_TABLET | Freq: Every day | ORAL | 0 refills | Status: DC

## 2021-07-04 NOTE — Discharge Instructions (Addendum)
Advised patient Influenza A/B were negative as was rapid strep.  Throat culture has been ordered.  Advised patient to take medication as directed with food to completion.  Advised patient to take Allegra with prednisone daily for the next 5 days, may use Allegra (sparingly while breast-feeding) as needed afterwards for concurrent postnasal drip/drainage.  Advised patient to use Promethazine DM in early evening or prior to sleep due to sedative properties.  Advised patient to use Promethazine DM sparingly while breast-feeding, short-term use is fine.  Encouraged patient to increase daily water intake while taking these medications.

## 2021-07-04 NOTE — ED Provider Notes (Signed)
[facility]    CSN: [id number] Arrival date & time: 07/04/21  1552      History   Chief Complaint Chief Complaint  Patient presents with   Sore Throat    HPI Shelia Delgado is a 35 y.o. female.   HPI 35 year old female presents with sore throat and cough for 1 week.  Patient was evaluated and treated for strep pharyngitis here on 06/18/2021 and reports completing prescribed course of Cephalexin.  Past Medical History:  Diagnosis Date   Allergy    COVID-19 2020   no vaccine   Dyshidrotic dermatitis    Medical history non-contributory    Overweight     Patient Active Problem List   Diagnosis Date Noted   History of gestational hypertension 03/21/2021   BMI 33.0-33.9,adult 01/26/2019   Dyshidrotic dermatitis     Past Surgical History:  Procedure Laterality Date   NO PAST SURGERIES     WISDOM TOOTH EXTRACTION      OB History     Gravida  1   Para  1   Term  1   Preterm  0   AB  0   Living  1      SAB  0   IAB  0   Ectopic  0   Multiple  0   Live Births  1            Home Medications    Prior to Admission medications   Medication Sig Start Date End Date Taking? Authorizing Provider  fexofenadine (ALLEGRA ALLERGY) 180 MG tablet Take 1 tablet (180 mg total) by mouth daily for 15 days. 07/04/21 07/19/21 Yes Trevor Iha, FNP  predniSONE (DELTASONE) 20 MG tablet Take 3 tabs PO daily x 5 days. 07/04/21  Yes Trevor Iha, FNP  promethazine-dextromethorphan (PROMETHAZINE-DM) 6.25-15 MG/5ML syrup Take 5 mLs by mouth 2 (two) times daily as needed for cough. 07/04/21  Yes Trevor Iha, FNP  norethindrone (MICRONOR) 0.35 MG tablet Take 1 tablet (0.35 mg total) by mouth daily. 06/30/21   Milas Hock, MD  polyethylene glycol powder [facility]) 17 GM/SCOOP powder Pt to use 1 scoop daily 03/29/21   Leftwich-Kirby, Wilmer Floor, CNM  Prenatal w/o A Vit-Fe Fum-FA (PRENATAL VITAMIN W/FE, FA) 29-1 MG CHEW Chew 1 tablet by mouth daily.  12/04/19   Sunnie Nielsen, DO    Family History Family History  Problem Relation Age of Onset   Hypertension Mother    Skin cancer Mother    Hypertension Father    Atrial fibrillation Father    Diabetes Father    Diabetes Maternal Grandfather    Lung disease Maternal Grandfather    Heart attack Paternal Grandfather    Heart attack Paternal Uncle    Healthy Brother     Social History Social History   Tobacco Use   Smoking status: Never    Passive exposure: Never   Smokeless tobacco: Never  Vaping Use   Vaping Use: Never used  Substance Use Topics   Alcohol use: Yes    Comment: occ   Drug use: Not Currently    Types: Marijuana     Allergies   Amoxicillin   Review of Systems Review of Systems  HENT:  Positive for sore throat.   Respiratory:  Positive for cough.     Physical Exam Triage Vital Signs ED Triage Vitals  Enc Vitals Group     BP 07/04/21 1602 121/78     Pulse Rate 07/04/21 1602 90  Resp 07/04/21 1602 16     Temp 07/04/21 1602 98.9 F (37.2 C)     Temp Source 07/04/21 1602 Oral     SpO2 07/04/21 1602 98 %     Weight 07/04/21 1605 185 lb 0.2 oz (83.9 kg)     Height --      Head Circumference --      Peak Flow --      Pain Score 07/04/21 1605 4     Pain Loc --      Pain Edu? --      Excl. in GC? --    No data found.  Updated Vital Signs BP 121/78 (BP Location: Right Arm)    Pulse 90    Temp 98.9 F (37.2 C) (Oral)    Resp 16    Wt 185 lb 0.2 oz (83.9 kg)    SpO2 98%    Breastfeeding Yes    BMI 31.76 kg/m      Physical Exam Vitals and nursing note reviewed.  Constitutional:      General: She is in acute distress.     Appearance: She is well-developed. She is obese.  HENT:     Head: Normocephalic and atraumatic.     Right Ear: Tympanic membrane and ear canal normal.     Left Ear: Tympanic membrane and ear canal normal.     Mouth/Throat:     Mouth: Mucous membranes are moist.     Pharynx: Oropharynx is clear. Uvula midline.  Posterior oropharyngeal erythema and uvula swelling present.     Comments: Moderate amount of clear drainage of posterior oropharynx noted Eyes:     Conjunctiva/sclera: Conjunctivae normal.     Pupils: Pupils are equal, round, and reactive to light.  Cardiovascular:     Rate and Rhythm: Normal rate and regular rhythm.     Heart sounds: Normal heart sounds.  Pulmonary:     Effort: Pulmonary effort is normal.     Breath sounds: Normal breath sounds. No wheezing, rhonchi or rales.     Comments: Infrequent nonproductive cough noted on exam Musculoskeletal:     Cervical back: Normal range of motion and neck supple.  Skin:    General: Skin is warm and dry.  Neurological:     General: No focal deficit present.     Mental Status: She is alert and oriented to person, place, and time.     UC Treatments / Results  Labs (all labs ordered are listed, but only abnormal results are displayed) Labs Reviewed  CULTURE, GROUP A STREP  POCT RAPID STREP A (OFFICE)  POCT INFLUENZA A/B    EKG   Radiology No results found.  Procedures Procedures (including critical care time)  Medications Ordered in UC Medications - No data to display  Initial Impression / Assessment and Plan / UC Course  I have reviewed the triage vital signs and the nursing notes.  Pertinent labs & imaging results that were available during my care of the patient were reviewed by me and considered in my medical decision making (see chart for details).     MDM: 1.  Acute pharyngitis-rapid strep negative, throat culture ordered; 2.  Cough-Influenza A/B were negative, Rx'd Prednisone and Promethazine DM; 3.  Allergic rhinitis-Rx'd Allegra. Advised patient Influenza A/B were negative as was rapid strep.  Throat culture has been ordered.  Advised patient to take medication as directed with food to completion.  Advised patient to take Allegra with prednisone daily for the next 5  days, may use Allegra (sparingly while  breast-feeding) as needed afterwards for concurrent postnasal drip/drainage.  Advised patient to use Promethazine DM in early evening or prior to sleep due to sedative properties.  Advised patient to use Promethazine DM sparingly while breast-feeding, short-term use is fine.  Encouraged patient to increase daily water intake while taking these medications.  Discharged home, hemodynamically stable. Final Clinical Impressions(s) / UC Diagnoses   Final diagnoses:  Acute pharyngitis, unspecified etiology  Cough, unspecified type  Allergic rhinitis, unspecified seasonality, unspecified trigger     Discharge Instructions      Advised patient Influenza A/B were negative as was rapid strep.  Throat culture has been ordered.  Advised patient to take medication as directed with food to completion.  Advised patient to take Allegra with prednisone daily for the next 5 days, may use Allegra (sparingly while breast-feeding) as needed afterwards for concurrent postnasal drip/drainage.  Advised patient to use Promethazine DM in early evening or prior to sleep due to sedative properties.  Advised patient to use Promethazine DM sparingly while breast-feeding, short-term use is fine.  Encouraged patient to increase daily water intake while taking these medications.     ED Prescriptions     Medication Sig Dispense Auth. Provider   predniSONE (DELTASONE) 20 MG tablet Take 3 tabs PO daily x 5 days. 15 tablet Trevor Iha, FNP   fexofenadine [facility] ALLERGY) 180 MG tablet Take 1 tablet (180 mg total) by mouth daily for 15 days. 15 tablet Trevor Iha, FNP   promethazine-dextromethorphan (PROMETHAZINE-DM) 6.25-15 MG/5ML syrup Take 5 mLs by mouth 2 (two) times daily as needed for cough. 118 mL Trevor Iha, FNP      PDMP not reviewed this encounter.   Trevor Iha, FNP 07/04/21 1733

## 2021-07-04 NOTE — ED Triage Notes (Signed)
Completed last round of antibiotics  Sore throat again x 1 week  Cough started 1 week ago  No fevers

## 2021-07-07 LAB — CULTURE, GROUP A STREP: Strep A Culture: NEGATIVE

## 2021-07-19 ENCOUNTER — Other Ambulatory Visit: Payer: Self-pay

## 2021-07-19 ENCOUNTER — Emergency Department
Admission: EM | Admit: 2021-07-19 | Discharge: 2021-07-19 | Disposition: A | Discharge status: Home / Self Care | Payer: 59 | Source: Home / Self Care | Attending: Emergency Medicine | Admitting: Emergency Medicine

## 2021-07-19 DIAGNOSIS — J0140 Acute pansinusitis, unspecified: Secondary | ICD-10-CM

## 2021-07-19 MED ORDER — FLUTICASONE PROPIONATE 50 MCG/ACT NA SUSP: 2.0000 | Freq: Every day | NASAL | 0 refills | Status: DC

## 2021-07-19 MED ORDER — DOXYCYCLINE HYCLATE 100 MG PO CAPS: 100.0000 mg | ORAL_CAPSULE | Freq: Two times a day (BID) | ORAL | 0 refills | Status: AC

## 2021-07-19 NOTE — ED Triage Notes (Signed)
Pt presents to Urgent Care with c/o persistent cough for approx 4 weeks and nasal congestion x 4 days w/ "sinus headaches" and facial pain since yesterday. Pt reports recently being treated for strep throat around the end of November 2022 and has had intermittent upper respiratory symptoms since. No recent COVID test.

## 2021-07-19 NOTE — ED Provider Notes (Signed)
HPI  SUBJECTIVE:  Shelia Delgado is a 35 y.o. female who presents with 5 days of sinus pain and pressure, Greenish-yellow rhinorrhea, nasal congestion.  No facial swelling, upper dental pain.  She also reports a cough occasionally productive of yellow phlegm for the past 4 weeks.  No fevers, wheezing, chest pain, shortness of breath, postnasal drip.  She was treated for strep throat with Keflex, finished this around 12/7.  No antipyretic in past 6 hours.  No allergy or GERD symptoms.  She has been taking Promethazine DM, Robitussin and cough drops with improvement in her cough, has not tried anything for sinus pain or pressure.  There are no aggravating or alleviating factors.  She has a past medical history of recurrent sinusitis, and allergies.  LMP: October 2021.  She is currently breast-feeding.  Denies the possibility being pregnant.  PMD: She is establishing care with Dr. Ria Clock next week.  Past Medical History:  Diagnosis Date   Allergy    COVID-19 2020   no vaccine   Dyshidrotic dermatitis    Medical history non-contributory    Overweight     Past Surgical History:  Procedure Laterality Date   NO PAST SURGERIES     WISDOM TOOTH EXTRACTION      Family History  Problem Relation Age of Onset   Hypertension Mother    Skin cancer Mother    Hypertension Father    Atrial fibrillation Father    Diabetes Father    Diabetes Maternal Grandfather    Lung disease Maternal Grandfather    Heart attack Paternal Grandfather    Heart attack Paternal Uncle    Healthy Brother     Social History   Tobacco Use   Smoking status: Never    Passive exposure: Never   Smokeless tobacco: Never  Vaping Use   Vaping Use: Never used  Substance Use Topics   Alcohol use: Yes    Comment: occ   Drug use: Not Currently    No current facility-administered medications for this encounter.  Current Outpatient Medications:    doxycycline (VIBRAMYCIN) 100 MG capsule, Take 1 capsule (100 mg  total) by mouth 2 (two) times daily for 10 days., Disp: 20 capsule, Rfl: 0   fluticasone (FLONASE) 50 MCG/ACT nasal spray, Place 2 sprays into both nostrils daily., Disp: 16 g, Rfl: 0   norethindrone (MICRONOR) 0.35 MG tablet, Take 1 tablet (0.35 mg total) by mouth daily., Disp: 28 tablet, Rfl: 12   polyethylene glycol powder (GLYCOLAX/MIRALAX) 17 GM/SCOOP powder, Pt to use 1 scoop daily, Disp: 850 g, Rfl: 1   Prenatal w/o A Vit-Fe Fum-FA (PRENATAL VITAMIN W/FE, FA) 29-1 MG CHEW, Chew 1 tablet by mouth daily., Disp: 90 tablet, Rfl: 3   promethazine-dextromethorphan (PROMETHAZINE-DM) 6.25-15 MG/5ML syrup, Take 5 mLs by mouth 2 (two) times daily as needed for cough., Disp: 118 mL, Rfl: 0  Allergies  Allergen Reactions   Amoxicillin Rash    Mild rash after taking for 1 week     ROS  As noted in HPI.   Physical Exam  BP 114/82 (BP Location: Right Arm)    Pulse 89    Temp 98.4 F (36.9 C) (Oral)    Resp 20    Ht 5\' 4"  (1.626 m)    Wt 86.2 kg    SpO2 98%    BMI 32.61 kg/m   Constitutional: Well developed, well nourished, no acute distress Eyes:  EOMI, conjunctiva normal bilaterally HENT: Normocephalic, atraumatic,mucus membranes moist. swollen, erythematous  turbinates.  Purulent nasal congestion.  Positive maxillary, frontal sinus tenderness.  Positive cobblestoning and postnasal drip. Respiratory: Normal inspiratory effort, lungs clear bilaterally, good air movement Cardiovascular: Normal rate regular rhythm, no murmurs, rubs, gallops GI: nondistended skin: No rash, skin intact Musculoskeletal: no deformities Neurologic: Alert & oriented x 3, no focal neuro deficits Psychiatric: Speech and behavior appropriate   ED Course   Medications - No data to display  No orders of the defined types were placed in this encounter.   No results found for this or any previous visit (from the past 24 hour(s)). No results found.  ED Clinical Impression  1. Acute non-recurrent pansinusitis       ED Assessment/Plan  Patient's primary concern today is the sinus infection.  She has purulent nasal congestion and exquisite sinus tenderness, so will start on doxycycline, Flonase, saline nasal irrigation, Mucinex.  Doxycycline will also cover any pulmonary infection, although I think pneumonia is less likely.  She was treated with cephalosporins less than a month ago.  Patient declined COVID testing follow-up with PMD as needed.  Discussed MDM, treatment plan, and plan for follow-up with patient. Discussed sn/sx that should prompt return to the ED. patient agrees with plan.   Meds ordered this encounter  Medications   doxycycline (VIBRAMYCIN) 100 MG capsule    Sig: Take 1 capsule (100 mg total) by mouth 2 (two) times daily for 10 days.    Dispense:  20 capsule    Refill:  0   fluticasone (FLONASE) 50 MCG/ACT nasal spray    Sig: Place 2 sprays into both nostrils daily.    Dispense:  16 g    Refill:  0      *This clinic note was created using Scientist, clinical (histocompatibility and immunogenetics). Therefore, there may be occasional mistakes despite careful proofreading.  ?    Domenick Gong, MD 07/19/21 1212

## 2021-07-19 NOTE — Discharge Instructions (Addendum)
Finish the doxycycline even if you feel better, Flonase, saline nasal irrigation with a NeilMed sinus rinse and distilled water as often as you want, Mucinex.

## 2021-07-27 ENCOUNTER — Ambulatory Visit: Payer: Commercial Managed Care - PPO | Admitting: Family

## 2021-07-27 ENCOUNTER — Encounter: Payer: Self-pay | Admitting: Family

## 2021-07-27 VITALS — BP 104/60 | HR 76 | Temp 97.8°F | Ht 64.0 in | Wt 198.6 lb

## 2021-07-27 DIAGNOSIS — J392 Other diseases of pharynx: Secondary | ICD-10-CM

## 2021-07-27 NOTE — Progress Notes (Signed)
Bre Delgado is a 36 y.o. female with the following history as recorded in [employer]:  Patient Active Problem List   Diagnosis Date Noted   History of gestational hypertension 03/21/2021   BMI 33.0-33.9,adult 01/26/2019   Dyshidrotic dermatitis     Current Outpatient Medications  Medication Sig Dispense Refill   docusate sodium (COLACE) 100 MG capsule Take 100 mg by mouth daily as needed.     doxycycline (VIBRAMYCIN) 100 MG capsule Take 1 capsule (100 mg total) by mouth 2 (two) times daily for 10 days. 20 capsule 0   fluticasone (FLONASE) 50 MCG/ACT nasal spray Place 2 sprays into both nostrils daily. 16 g 0   norethindrone (MICRONOR) 0.35 MG tablet Take 1 tablet (0.35 mg total) by mouth daily. 28 tablet 12   polyethylene glycol powder (GLYCOLAX/MIRALAX) 17 GM/SCOOP powder Pt to use 1 scoop daily 850 g 1   Prenatal w/o A Vit-Fe Fum-FA (PRENATAL VITAMIN W/FE, FA) 29-1 MG CHEW Chew 1 tablet by mouth daily. 90 tablet 3   No current facility-administered medications for this visit.    Allergies: Amoxicillin  Past Medical History:  Diagnosis Date   Allergy    COVID-19 2020   no vaccine   Dyshidrotic dermatitis    Medical history non-contributory    Overweight     Past Surgical History:  Procedure Laterality Date   NO PAST SURGERIES     WISDOM TOOTH EXTRACTION      Family History  Problem Relation Age of Onset   Hypertension Mother    Skin cancer Mother    Hypertension Father    Atrial fibrillation Father    Diabetes Father    Diabetes Maternal Grandfather    Lung disease Maternal Grandfather    Heart attack Paternal Grandfather    Heart attack Paternal Uncle    Healthy Brother     Social History   Tobacco Use   Smoking status: Never    Passive exposure: Never   Smokeless tobacco: Never  Substance Use Topics   Alcohol use: Yes    Comment: occ    Subjective:   Presents today as a new patient; 5 month post-partum/ breast-feeding;  Concern for sensation of  "sandpaper"/ scratchiness inside of throat x 3-4 weeks on and off; sensation originally developed earlier in 2022 with pregnancy and resolved by end of pregnancy; no difficulty swallowing or sensation of food getting stuck; feels like irritation is at tracheal level;   Objective:  Vitals:   07/27/21 1409  BP: 104/60  Pulse: 76  Temp: 97.8 F (36.6 C)  TempSrc: Oral  SpO2: 98%  Weight: 198 lb 9.6 oz (90.1 kg)  Height: 5\' 4"  (1.626 m)    General: Well developed, well nourished, in no acute distress  Skin : Warm and dry.  Head: Normocephalic and atraumatic  Eyes: Sclera and conjunctiva clear; pupils round and reactive to light; extraocular movements intact  Ears: External normal; canals clear; tympanic membranes normal  Oropharynx: Pink, supple. No suspicious lesions  Neck: Supple without thyromegaly, adenopathy  Lungs: Respirations unlabored; clear to auscultation bilaterally without wheeze, rales, rhonchi  CVS exam: normal rate and regular rhythm.  Neurologic: Alert and oriented; speech intact; face symmetrical; moves all extremities well; CNII-XII intact without focal deficit   Assessment:  1. Throat irritation     Plan:  Refer to ENT for further evaluation;  Plan for CPE at her convenience in May 2023;   This visit occurred during the SARS-CoV-2 public health emergency.  Safety protocols  were in place, including screening questions prior to the visit, additional usage of staff PPE, and extensive cleaning of exam room while observing appropriate contact time as indicated for disinfecting solutions.    Return in about 4 months (around 11/24/2021) for CPE.  Orders Placed This Encounter  Procedures   Ambulatory referral to ENT    Referral Priority:   Routine    Referral Type:   Consultation    Referral Reason:   Specialty Services Required    Requested Specialty:   Otolaryngology    Number of Visits Requested:   1    Requested Prescriptions    No prescriptions requested or  ordered in this encounter

## 2021-08-24 DIAGNOSIS — J312 Chronic pharyngitis: Secondary | ICD-10-CM | POA: Insufficient documentation

## 2021-08-27 ENCOUNTER — Encounter: Payer: Self-pay | Admitting: Family

## 2021-08-28 ENCOUNTER — Encounter: Payer: Self-pay | Admitting: Obstetrics and Gynecology

## 2021-08-28 ENCOUNTER — Other Ambulatory Visit: Payer: Self-pay | Admitting: Family

## 2021-08-28 DIAGNOSIS — L989 Disorder of the skin and subcutaneous tissue, unspecified: Secondary | ICD-10-CM

## 2021-10-08 ENCOUNTER — Encounter: Payer: Self-pay | Admitting: Obstetrics & Gynecology

## 2021-10-11 ENCOUNTER — Encounter: Payer: Self-pay | Admitting: Advanced Practice Midwife

## 2021-10-12 ENCOUNTER — Other Ambulatory Visit: Payer: Self-pay | Admitting: *Deleted

## 2021-10-12 MED ORDER — NORETHINDRONE 0.35 MG PO TABS: 1.0000 | ORAL_TABLET | Freq: Every day | ORAL | 3 refills | Status: DC

## 2021-11-30 IMAGING — US US PELVIS COMPLETE WITH TRANSVAGINAL
1 series · 14 of 25 positions shown · non-contrast
Comparison: None

CLINICAL DATA: Abnormal uterine bleeding, had a vaginal delivery 1
month ago

EXAM:
TRANSABDOMINAL AND TRANSVAGINAL ULTRASOUND OF PELVIS
TECHNIQUE: Both transabdominal and transvaginal ultrasound examinations of the
pelvis were performed. Transabdominal technique was performed for
global imaging of the pelvis including uterus, ovaries, adnexal
regions, and pelvic cul-de-sac. It was necessary to proceed with
endovaginal exam following the transabdominal exam to visualize the
endometrium and adnexa.

[Series 1: us pelvic complete with transvaginal · 14 of 85 slices shown]
[im 1/85]
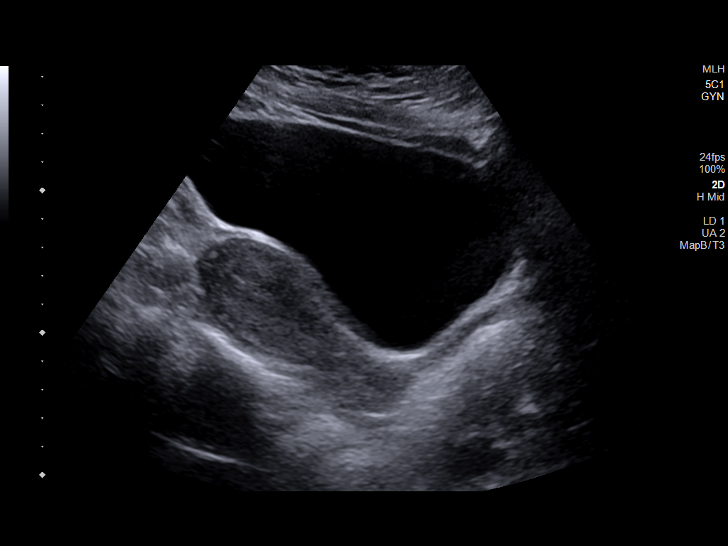
[im 8/85]
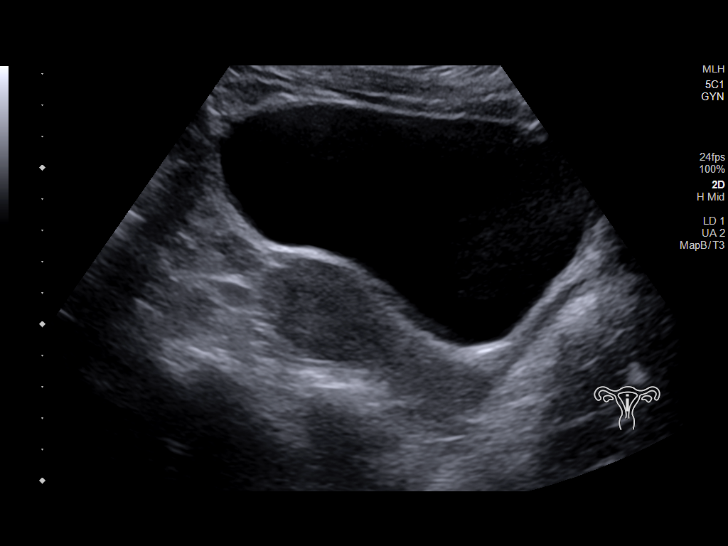
[im 15/85]
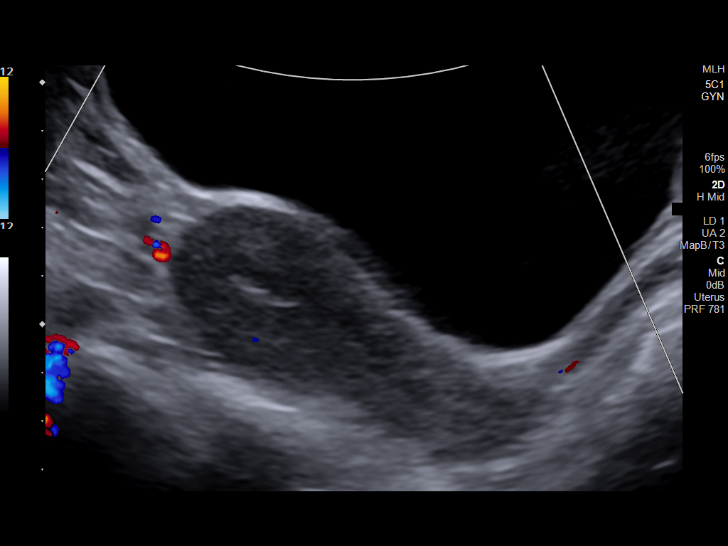
[im 22/85]
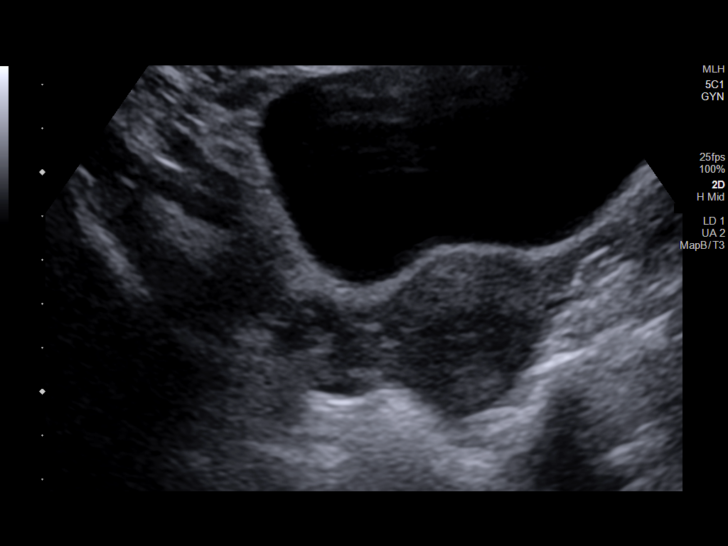
[im 29/85]
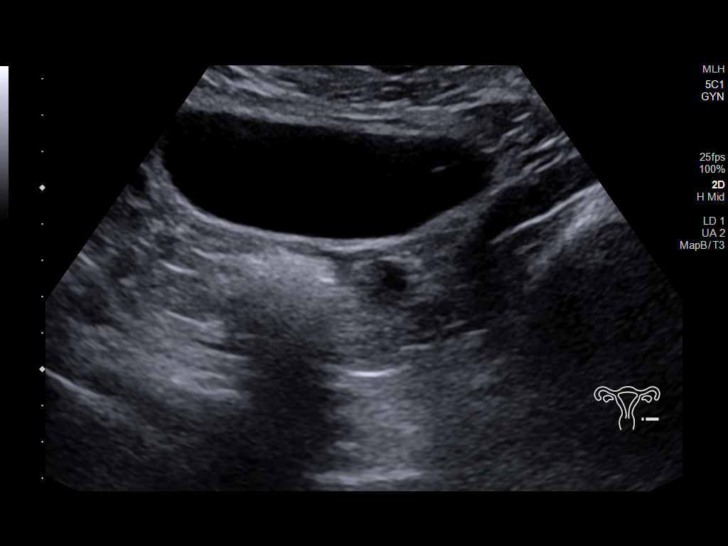
[im 32/85]
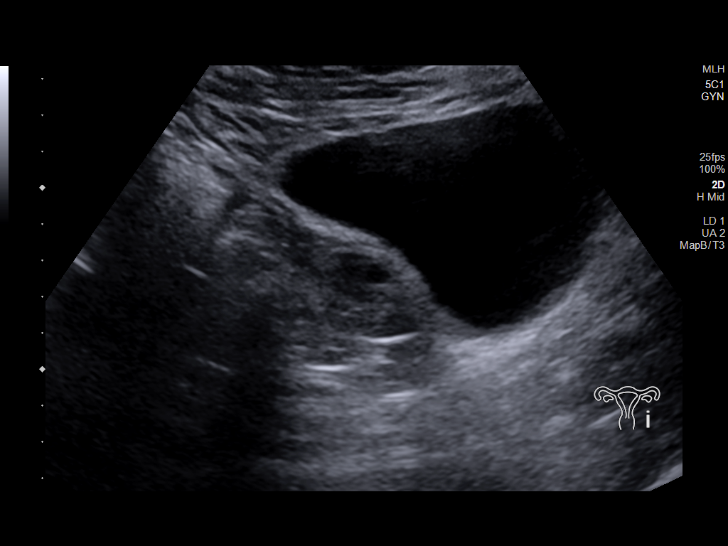
[im 39/85]
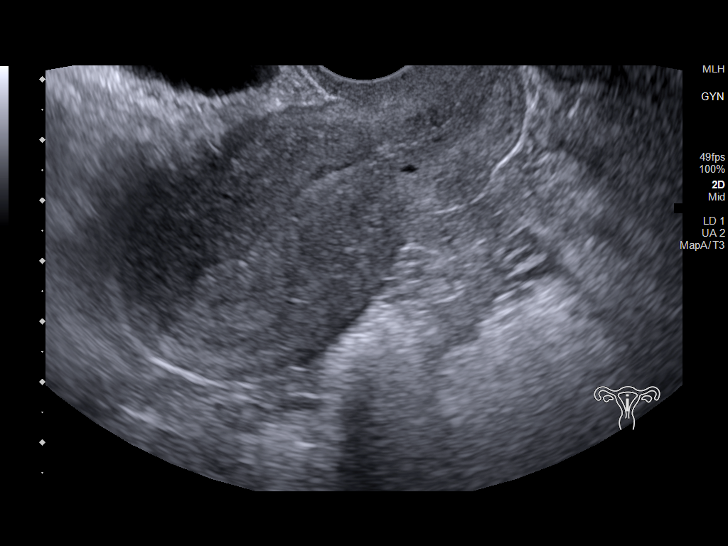
[im 46/85]
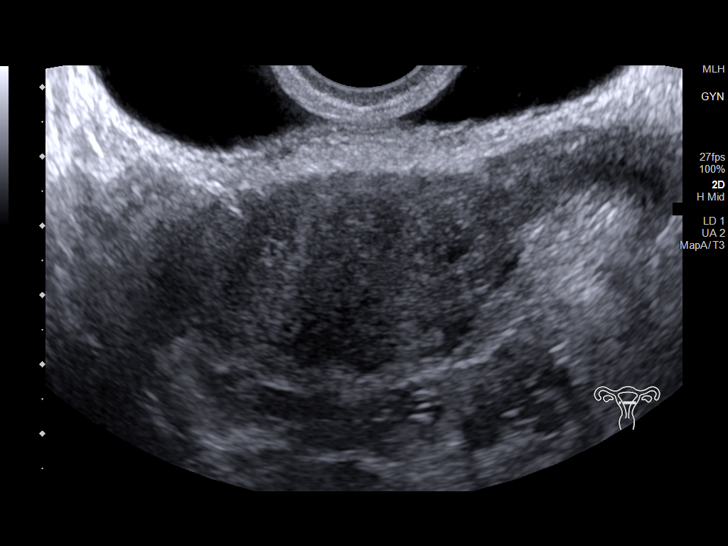
[im 53/85]
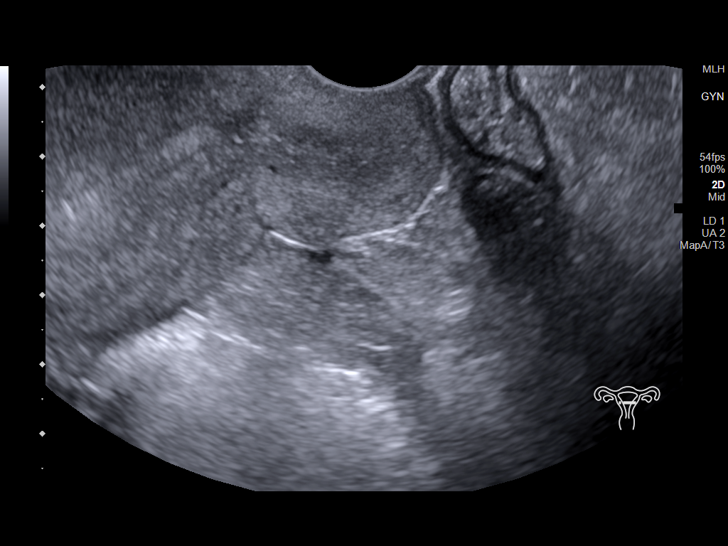
[im 57/85]
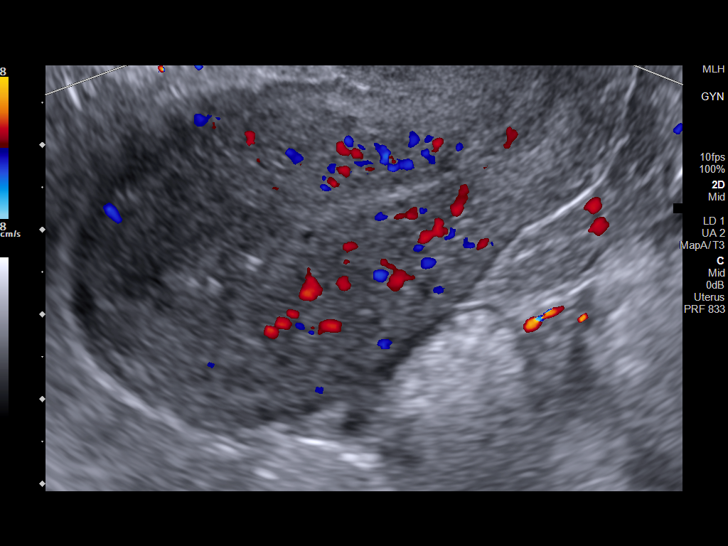
[im 64/85]
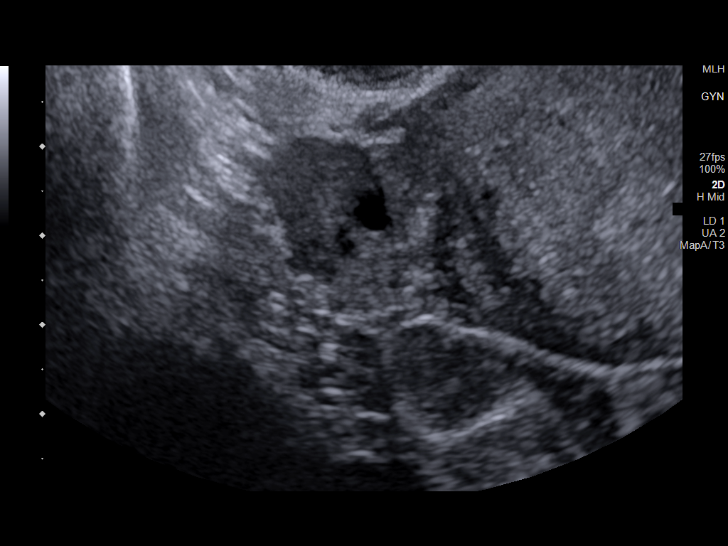
[im 71/85]
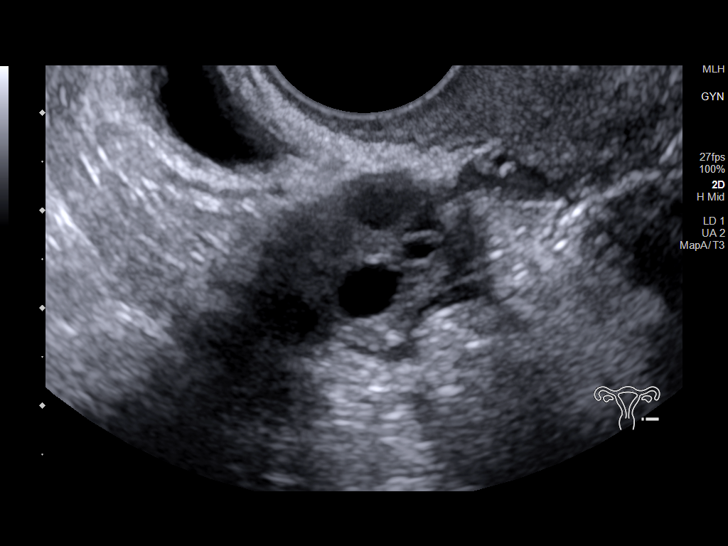
[im 78/85]
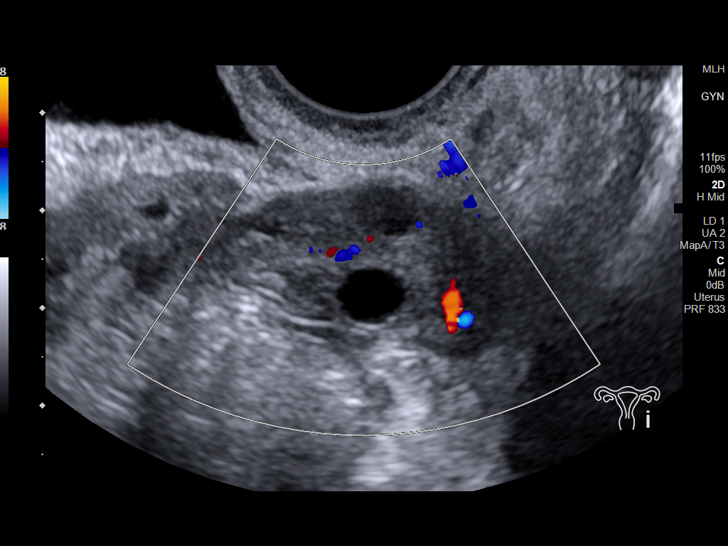
[im 85/85]
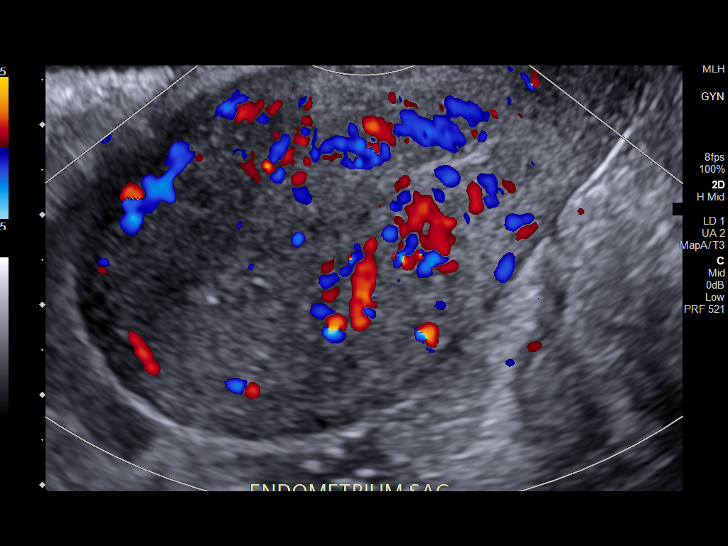

[14 of 25 positions shown; findings below may reference images not displayed]

FINDINGS: Uterus

Measurements: 8.6 x 3.7 x 5.4 cm = volume: 89 mL. Anteverted. Normal
morphology without mass

Endometrium

Thickness: 2 mm. Few tiny nonspecific echogenic foci are seen. No
mass or fluid identified.

Right ovary

Measurements: 2.3 x 1.2 x 1.7 cm = volume: 2 mL. Normal morphology
without mass

Left ovary

Measurements: 2.7 x 1.5 x 2.1 cm = volume: 4 mL. Normal morphology
without mass

Other findings

No free pelvic fluid.  No adnexal masses.
IMPRESSION: No definite pelvic sonographic abnormalities.

## 2021-12-04 ENCOUNTER — Encounter: Payer: Self-pay | Admitting: Family Medicine

## 2021-12-04 ENCOUNTER — Ambulatory Visit: Payer: Commercial Managed Care - PPO | Admitting: Family

## 2021-12-04 ENCOUNTER — Ambulatory Visit: Payer: Commercial Managed Care - PPO | Admitting: Family Medicine

## 2021-12-04 VITALS — BP 114/75 | HR 112 | Temp 97.8°F | Ht 64.0 in | Wt 212.4 lb

## 2021-12-04 DIAGNOSIS — M722 Plantar fascial fibromatosis: Secondary | ICD-10-CM | POA: Diagnosis not present

## 2021-12-04 DIAGNOSIS — J0140 Acute pansinusitis, unspecified: Secondary | ICD-10-CM

## 2021-12-04 MED ORDER — CEFPODOXIME PROXETIL 200 MG PO TABS: 200.0000 mg | ORAL_TABLET | Freq: Two times a day (BID) | ORAL | 0 refills | Status: AC

## 2021-12-04 MED ORDER — GUAIFENESIN ER 600 MG PO TB12: 1200.0000 mg | ORAL_TABLET | Freq: Two times a day (BID) | ORAL | 2 refills | Status: DC

## 2021-12-04 NOTE — Progress Notes (Signed)
Sick for one week ?Coughing up green phlegm ?Sinus pain/pressure ?Currently breastfeeding ?Wants work note to be able to sit at work for foot pain ?

## 2021-12-04 NOTE — Patient Instructions (Addendum)
Start antibiotics. Take Mucinex and continue allergy medication. Continue supportive measures including rest, hydration, humidifier use, steam showers, warm compresses to sinuses, warm liquids with lemon and honey, and over-the-counter cough, cold, and analgesics as needed.  Follow-up if not improving and we can consider steroids if needed.  ? ?For plantar fasciitis: ?Review handout. ?Try ibuprofen regularly for the next week or two. ?Ice, stretches, home exercises.  ?Follow-up if not improving.  ?

## 2021-12-04 NOTE — Progress Notes (Signed)
? ?Acute Office Visit ? ?Subjective:  ? ?  ?Patient ID: Shelia Delgado, female    DOB: [birth year], 36 y.o.   MRN: [id number] ? ?CC: upper respiratory infection  ? ? ?URI  ?This is a new problem. The current episode started 1 to 4 weeks ago (7-9 days ago (after allergy symptoms)). The problem has been gradually worsening. There has been no fever. Associated symptoms include congestion, coughing, headaches, rhinorrhea, sinus pain, sneezing, a sore throat and wheezing. Pertinent negatives include no abdominal pain, chest pain, diarrhea, dysuria, ear pain, joint pain, joint swelling, nausea, neck pain, plugged ear sensation, rash, swollen glands or vomiting. She has tried antihistamine for the symptoms. The treatment provided mild relief.  ? ? ? ? ?Bilatereal plantar faciitis: ?Patient reports she is having a flare of bilateral plantar fasciitis - has not been treated for this before. She has not yet tried any home remedies. No radiation of symptoms. She would like a note that allows her to sit at work occasionally.  ? ? ? ?Review of systems ?All review of systems negative except what is listed in the HPI ? ? ? ?   ?Objective:  ?  ?BP 114/75   Pulse (!) 112   Temp 97.8 ?F (36.6 ?C)   Ht 5\' 4"  (1.626 m)   Wt 212 lb 6.4 oz (96.3 kg)   SpO2 98%   BMI 36.46 kg/m?  ? ? ?Physical Exam ?Vitals reviewed.  ?Constitutional:   ?   General: She is not in acute distress. ?   Appearance: Normal appearance. She is obese. She is not ill-appearing.  ?HENT:  ?   Head: Normocephalic and atraumatic.  ?   Right Ear: Tympanic membrane normal.  ?   Left Ear: Tympanic membrane normal.  ?   Nose: Congestion and rhinorrhea present.  ?   Mouth/Throat:  ?   Mouth: Mucous membranes are moist.  ?   Pharynx: Oropharynx is clear. No oropharyngeal exudate or posterior oropharyngeal erythema.  ?Eyes:  ?   Conjunctiva/sclera: Conjunctivae normal.  ?Cardiovascular:  ?   Rate and Rhythm: Normal rate and regular rhythm.  ?Pulmonary:  ?   Effort:  Pulmonary effort is normal.  ?   Breath sounds: Normal breath sounds. No wheezing, rhonchi or rales.  ?Musculoskeletal:     ?   General: Normal range of motion.  ?   Cervical back: Normal range of motion and neck supple. No tenderness.  ?   Comments: Mild tenderness to deep palpation of bilat plantar fascia   ?Lymphadenopathy:  ?   Cervical: No cervical adenopathy.  ?Skin: ?   General: Skin is warm and dry.  ?   Findings: No bruising, erythema or rash.  ?Neurological:  ?   General: No focal deficit present.  ?   Mental Status: She is alert and oriented to person, place, and time. Mental status is at baseline.  ?Psychiatric:     ?   Mood and Affect: Mood normal.     ?   Behavior: Behavior normal.     ?   Thought Content: Thought content normal.     ?   Judgment: Judgment normal.  ? ? ?No results found for any visits on 12/04/21. ? ? ?   ?Assessment & Plan:  ? ?1. Acute non-recurrent pansinusitis ?Start antibiotics. Take Mucinex and continue allergy medication. Continue supportive measures including rest, hydration, humidifier use, steam showers, warm compresses to sinuses, warm liquids with lemon and honey, and over-the-counter cough, cold,  and analgesics as needed.  Follow-up if not improving and we can consider steroids if needed. ? ?- cefpodoxime (VANTIN) 200 MG tablet; Take 1 tablet (200 mg total) by mouth 2 (two) times daily for 7 days.  Dispense: 14 tablet; Refill: 0 ?- guaiFENesin (MUCINEX) 600 MG 12 hr tablet; Take 2 tablets (1,200 mg total) by mouth 2 (two) times daily.  Dispense: 30 tablet; Refill: 2 ? ? ?2. Plantar fasciitis, bilateral ?Review handout. ?Try ibuprofen regularly for the next week or two. ?Ice, stretches, home exercises.  ?Follow-up if not improving.  ? ? ?Meds ordered this encounter  ?Medications  ? cefpodoxime (VANTIN) 200 MG tablet  ?  Sig: Take 1 tablet (200 mg total) by mouth 2 (two) times daily for 7 days.  ?  Dispense:  14 tablet  ?  Refill:  0  ?  Order Specific Question:    Supervising Provider  ?  Answer:   Danise Edge A [[id number]]  ? guaiFENesin (MUCINEX) 600 MG 12 hr tablet  ?  Sig: Take 2 tablets (1,200 mg total) by mouth 2 (two) times daily.  ?  Dispense:  30 tablet  ?  Refill:  2  ?  Order Specific Question:   Supervising Provider  ?  Answer:   Danise Edge A [[id number]]  ? ? ?Return if symptoms worsen or fail to improve. ? ?Clayborne Dana, NP ? ? ?

## 2021-12-11 ENCOUNTER — Encounter: Payer: 59 | Admitting: Medical-Surgical

## 2021-12-11 ENCOUNTER — Encounter: Payer: 59 | Admitting: Osteopathic Medicine

## 2021-12-14 ENCOUNTER — Encounter: Payer: Self-pay | Admitting: Family

## 2021-12-14 NOTE — Telephone Encounter (Signed)
Spoke w/ Pt- scheduled visit w/ Vernona Rieger for tomorrow.

## 2021-12-15 ENCOUNTER — Ambulatory Visit (HOSPITAL_BASED_OUTPATIENT_CLINIC_OR_DEPARTMENT_OTHER)
Admission: RE | Admit: 2021-12-15 | Discharge: 2021-12-15 | Disposition: A | Discharge status: Home / Self Care | Payer: Commercial Managed Care - PPO | Source: Ambulatory Visit | Attending: Family | Admitting: Family

## 2021-12-15 ENCOUNTER — Ambulatory Visit: Payer: Commercial Managed Care - PPO | Admitting: Family

## 2021-12-15 VITALS — BP 100/68 | HR 94 | Temp 97.7°F | Resp 16 | Ht 64.0 in | Wt 215.6 lb

## 2021-12-15 DIAGNOSIS — M25472 Effusion, left ankle: Secondary | ICD-10-CM | POA: Diagnosis not present

## 2021-12-15 DIAGNOSIS — R042 Hemoptysis: Secondary | ICD-10-CM | POA: Insufficient documentation

## 2021-12-15 NOTE — Progress Notes (Signed)
Shelia Delgado is a 36 y.o. female with the following history as recorded in [employer]:  Patient Active Problem List   Diagnosis Date Noted   History of gestational hypertension 03/21/2021   BMI 33.0-33.9,adult 01/26/2019   Dyshidrotic dermatitis     Current Outpatient Medications  Medication Sig Dispense Refill   docusate sodium (COLACE) 100 MG capsule Take 100 mg by mouth daily as needed.     fluticasone (FLONASE) 50 MCG/ACT nasal spray Place 2 sprays into both nostrils daily. 16 g 0   guaiFENesin (MUCINEX) 600 MG 12 hr tablet Take 2 tablets (1,200 mg total) by mouth 2 (two) times daily. 30 tablet 2   norethindrone (MICRONOR) 0.35 MG tablet Take 1 tablet (0.35 mg total) by mouth daily. 84 tablet 3   polyethylene glycol powder (GLYCOLAX/MIRALAX) 17 GM/SCOOP powder Pt to use 1 scoop daily 850 g 1   Prenatal w/o A Vit-Fe Fum-FA (PRENATAL VITAMIN W/FE, FA) 29-1 MG CHEW Chew 1 tablet by mouth daily. 90 tablet 3   No current facility-administered medications for this visit.    Allergies: Amoxicillin  Past Medical History:  Diagnosis Date   Allergy    COVID-19 2020   no vaccine   Dyshidrotic dermatitis    Medical history non-contributory    Overweight     Past Surgical History:  Procedure Laterality Date   NO PAST SURGERIES     WISDOM TOOTH EXTRACTION      Family History  Problem Relation Age of Onset   Hypertension Mother    Skin cancer Mother    Hypertension Father    Atrial fibrillation Father    Diabetes Father    Diabetes Maternal Grandfather    Lung disease Maternal Grandfather    Heart attack Paternal Grandfather    Heart attack Paternal Uncle    Healthy Brother     Social History   Tobacco Use   Smoking status: Never    Passive exposure: Never   Smokeless tobacco: Never  Substance Use Topics   Alcohol use: Yes    Comment: occ    Subjective:  Husband and daughter present;   Patient was treated for sinus infection approximately 10 days ago; does feel that  symptoms have improved; was concerned that earlier this week had episode of coughing up blood tinged mucus; denies coughing up frank blood; no fever, chest pain or shortness of breath;  Became concerned when she woke up the next day and notes her left ankle was swollen without any obvious reason; friend told her to get checked for DVT due to presentation of symptoms; denies any calf pain, redness of the calf; notes that swelling has resolved since she scheduled the original appointment;      Objective:  Vitals:   12/15/21 1355  BP: 100/68  Pulse: 94  Resp: 16  Temp: 97.7 F (36.5 C)  TempSrc: Oral  SpO2: 97%  Weight: 215 lb 9.6 oz (97.8 kg)  Height: 5\' 4"  (1.626 m)    General: Well developed, well nourished, in no acute distress  Skin : Warm and dry.  Head: Normocephalic and atraumatic  Eyes: Sclera and conjunctiva clear; pupils round and reactive to light; extraocular movements intact  Ears: External normal; canals clear; tympanic membranes normal  Oropharynx: Pink, supple. No suspicious lesions  Neck: Supple without thyromegaly, adenopathy  Lungs: Respirations unlabored; clear to auscultation bilaterally without wheeze, rales, rhonchi  CVS exam: normal rate and regular rhythm.  Abdomen: Soft; nontender; nondistended; normoactive bowel sounds; no masses or  hepatosplenomegaly  Musculoskeletal: No deformities; no active joint inflammation  Extremities: No edema, cyanosis, clubbing; negative Homan's sign; no redness/ tenderness or swelling noted of left calf;  Vessels: Symmetric bilaterally  Neurologic: Alert and oriented; speech intact; face symmetrical; moves all extremities well; CNII-XII intact without focal deficit   Assessment:  1. Cough with hemoptysis   2. Edema of left ankle     Plan:  Reassurance- very low suspicion for DVT; due to recent sinus infection will update CXR to make sure no underlying pneumonia; check CBC today; Low suspicion for DVT- check D-dimer;   No  follow-ups on file.  Orders Placed This Encounter  Procedures   DG Chest 2 View    Standing Status:   Future    Number of Occurrences:   1    Standing Expiration Date:   12/16/2022    Order Specific Question:   Reason for Exam (SYMPTOM  OR DIAGNOSIS REQUIRED)    Answer:   cough    Order Specific Question:   Is patient pregnant?    Answer:   No    Comments:   breast feeding    Order Specific Question:   Preferred imaging location?    Answer:   [facility]   CBC with Differential/Platelet   D-Dimer, Quantitative    Requested Prescriptions    No prescriptions requested or ordered in this encounter

## 2021-12-16 LAB — CBC WITH DIFFERENTIAL/PLATELET
Absolute Monocytes: 663 cells/uL (ref 200–950)
Basophils Absolute: 64 cells/uL (ref 0–200)
Basophils Relative: 0.6 %
Eosinophils Absolute: 235 cells/uL (ref 15–500)
Eosinophils Relative: 2.2 %
HCT: 38.2 % (ref 35.0–45.0)
Hemoglobin: 12.8 g/dL (ref 11.7–15.5)
Lymphs Abs: 3863 cells/uL (ref 850–3900)
MCH: 28.8 pg (ref 27.0–33.0)
MCHC: 33.5 g/dL (ref 32.0–36.0)
MCV: 85.8 fL (ref 80.0–100.0)
MPV: 11.3 fL (ref 7.5–12.5)
Monocytes Relative: 6.2 %
Neutro Abs: 5874 cells/uL (ref 1500–7800)
Neutrophils Relative %: 54.9 %
Platelets: 322 10*3/uL (ref 140–400)
RBC: 4.45 10*6/uL (ref 3.80–5.10)
RDW: 13.9 % (ref 11.0–15.0)
Total Lymphocyte: 36.1 %
WBC: 10.7 10*3/uL (ref 3.8–10.8)

## 2021-12-16 LAB — D-DIMER, QUANTITATIVE: D-Dimer, Quant: 0.23 mcg/mL FEU (ref <0.50)

## 2021-12-19 ENCOUNTER — Encounter: Payer: Commercial Managed Care - PPO | Admitting: Family

## 2021-12-19 ENCOUNTER — Ambulatory Visit (INDEPENDENT_AMBULATORY_CARE_PROVIDER_SITE_OTHER): Payer: Commercial Managed Care - PPO | Admitting: Family

## 2021-12-19 VITALS — BP 122/72 | HR 91 | Temp 97.8°F | Resp 18 | Ht 64.0 in | Wt 216.8 lb

## 2021-12-19 DIAGNOSIS — M722 Plantar fascial fibromatosis: Secondary | ICD-10-CM | POA: Diagnosis not present

## 2021-12-19 DIAGNOSIS — Z13220 Encounter for screening for lipoid disorders: Secondary | ICD-10-CM

## 2021-12-19 DIAGNOSIS — Z0000 Encounter for general adult medical examination without abnormal findings: Secondary | ICD-10-CM | POA: Diagnosis not present

## 2021-12-19 LAB — LIPID PANEL
Cholesterol: 154 mg/dL (ref 0–200)
HDL: 45.2 mg/dL (ref >39.00)
LDL Cholesterol: 73 mg/dL (ref 0–99)
NonHDL: 108.96
Total CHOL/HDL Ratio: 3
Triglycerides: 181 mg/dL — ABNORMAL HIGH (ref 0.0–149.0)
VLDL: 36.2 mg/dL (ref 0.0–40.0)

## 2021-12-19 LAB — COMPREHENSIVE METABOLIC PANEL
ALT: 26 U/L (ref 0–35)
AST: 19 U/L (ref 0–37)
Albumin: 4.4 g/dL (ref 3.5–5.2)
Alkaline Phosphatase: 132 U/L — ABNORMAL HIGH (ref 39–117)
BUN: 11 mg/dL (ref 6–23)
CO2: 22 mEq/L (ref 19–32)
Calcium: 9.1 mg/dL (ref 8.4–10.5)
Chloride: 104 mEq/L (ref 96–112)
Creatinine, Ser: 0.73 mg/dL (ref 0.40–1.20)
GFR: 107.08 mL/min (ref >60.00)
Glucose, Bld: 109 mg/dL — ABNORMAL HIGH (ref 70–99)
Potassium: 4.1 mEq/L (ref 3.5–5.1)
Sodium: 137 mEq/L (ref 135–145)
Total Bilirubin: 0.3 mg/dL (ref 0.2–1.2)
Total Protein: 6.9 g/dL (ref 6.0–8.3)

## 2021-12-19 LAB — TSH: TSH: 1.71 u[IU]/mL (ref 0.35–5.50)

## 2021-12-19 NOTE — Patient Instructions (Signed)
Please let [facility] know we have sent you over there to evaluate your feet;

## 2021-12-19 NOTE — Progress Notes (Signed)
Shelia Delgado is a 36 y.o. female with the following history as recorded in EpicCare:  Patient Active Problem List   Diagnosis Date Noted   History of gestational hypertension 03/21/2021   BMI 33.0-33.9,adult 01/26/2019   Dyshidrotic dermatitis     Current Outpatient Medications  Medication Sig Dispense Refill   docusate sodium (COLACE) 100 MG capsule Take 100 mg by mouth daily as needed.     fluticasone (FLONASE) 50 MCG/ACT nasal spray Place 2 sprays into both nostrils daily. 16 g 0   norethindrone (MICRONOR) 0.35 MG tablet Take 1 tablet (0.35 mg total) by mouth daily. 84 tablet 3   Prenatal w/o A Vit-Fe Fum-FA (PRENATAL VITAMIN W/FE, FA) 29-1 MG CHEW Chew 1 tablet by mouth daily. 90 tablet 3   No current facility-administered medications for this visit.    Allergies: Amoxicillin  Past Medical History:  Diagnosis Date   Allergy    COVID-19 2020   no vaccine   Dyshidrotic dermatitis    Medical history non-contributory    Overweight     Past Surgical History:  Procedure Laterality Date   NO PAST SURGERIES     WISDOM TOOTH EXTRACTION      Family History  Problem Relation Age of Onset   Hypertension Mother    Skin cancer Mother    Hypertension Father    Atrial fibrillation Father    Diabetes Father    Diabetes Maternal Grandfather    Lung disease Maternal Grandfather    Heart attack Paternal Grandfather    Heart attack Paternal Uncle    Healthy Brother     Social History   Tobacco Use   Smoking status: Never    Passive exposure: Never   Smokeless tobacco: Never  Substance Use Topics   Alcohol use: Yes    Comment: occ    Subjective:   Presents for yearly CPE; does have GYN; currently breast-feeding;  Up to date on dentist; no concerns for vision changes;  Review of Systems  Constitutional: Negative.   HENT: Negative.    Eyes: Negative.   Respiratory: Negative.    Cardiovascular: Negative.   Gastrointestinal: Negative.   Genitourinary: Negative.    Musculoskeletal:  Positive for joint pain and myalgias.  Skin: Negative.   Neurological: Negative.   Endo/Heme/Allergies: Negative.   Psychiatric/Behavioral: Negative.        Objective:  Vitals:   12/19/21 1308  BP: 122/72  Pulse: 91  Resp: 18  Temp: 97.8 F (36.6 C)  TempSrc: Oral  SpO2: 97%  Weight: 216 lb 12.8 oz (98.3 kg)  Height: _0  (1.626 m)    General: Well developed, well nourished, in no acute distress  Skin : Warm and dry.  Head: Normocephalic and atraumatic  Eyes: Sclera and conjunctiva clear; pupils round and reactive to light; extraocular movements intact  Ears: External normal; canals clear; tympanic membranes normal  Oropharynx: Pink, supple. No suspicious lesions  Neck: Supple without thyromegaly, adenopathy  Lungs: Respirations unlabored; clear to auscultation bilaterally without wheeze, rales, rhonchi  CVS exam: normal rate and regular rhythm.  Abdomen: Soft; nontender; nondistended; normoactive bowel sounds; no masses or hepatosplenomegaly  Musculoskeletal: No deformities; no active joint inflammation  Extremities: No edema, cyanosis, clubbing  Vessels: Symmetric bilaterally  Neurologic: Alert and oriented; speech intact; face symmetrical; moves all extremities well; CNII-XII intact without focal deficit  Assessment:  1. PE (physical exam), annual   2. Plantar fasciitis   3. Lipid screening     Plan:  Age appropriate  preventive healthcare needs addressed; encouraged regular eye doctor and dental exams; encouraged regular exercise; will update labs and refills as needed today; follow-up to be determined; Refer to podiatrist;  Follow up in 1 year, sooner prn.   No follow-ups on file.  Orders Placed This Encounter  Procedures   Comp Met (CMET)   TSH   Lipid panel   Ambulatory referral to Podiatry    Referral Priority:   Routine    Referral Type:   Consultation    Referral Reason:   Specialty Services Required    Requested Specialty:    Podiatry    Number of Visits Requested:   1    Requested Prescriptions    No prescriptions requested or ordered in this encounter

## 2021-12-20 ENCOUNTER — Other Ambulatory Visit: Payer: Self-pay | Admitting: Family

## 2021-12-20 DIAGNOSIS — R7989 Other specified abnormal findings of blood chemistry: Secondary | ICD-10-CM

## 2021-12-29 ENCOUNTER — Ambulatory Visit (INDEPENDENT_AMBULATORY_CARE_PROVIDER_SITE_OTHER): Payer: Commercial Managed Care - PPO | Admitting: Podiatry

## 2021-12-29 ENCOUNTER — Ambulatory Visit (INDEPENDENT_AMBULATORY_CARE_PROVIDER_SITE_OTHER): Payer: Commercial Managed Care - PPO

## 2021-12-29 ENCOUNTER — Encounter: Payer: Self-pay | Admitting: Podiatry

## 2021-12-29 DIAGNOSIS — M722 Plantar fascial fibromatosis: Secondary | ICD-10-CM

## 2021-12-29 DIAGNOSIS — M79672 Pain in left foot: Secondary | ICD-10-CM | POA: Diagnosis not present

## 2021-12-29 NOTE — Progress Notes (Signed)
  Subjective:  Patient ID: Shelia Delgado, female    DOB: 18-Oct-1985,   MRN: [id number]  No chief complaint on file.   36 y.o. female presents for concern of bilateral heel pain that has been going on for about two months. Relates most pain when getting up from sitting.  Has been taking ibuprofen occasionally, but currently breastfeeding. . Denies any other pedal complaints. Denies n/v/f/c.   Past Medical History:  Diagnosis Date   Allergy    COVID-19 2020   no vaccine   Dyshidrotic dermatitis    Medical history non-contributory    Overweight     Objective:  Physical Exam: Vascular: DP/PT pulses 2/4 bilateral. CFT <3 seconds. Normal hair growth on digits. No edema.  Skin. No lacerations or abrasions bilateral feet.  Musculoskeletal: MMT 5/5 bilateral lower extremities in DF, PF, Inversion and Eversion. Deceased ROM in DF of ankle joint. Tender to medial calcaneal tubercle on bilateral. No pain along achilles, PT or arch. No pain with calcaneal squeeze.  Neurological: Sensation intact to light touch.   Assessment:   1. Plantar fasciitis, bilateral      Plan:  Patient was evaluated and treated and all questions answered. Discussed plantar fasciitis with patient.  X-rays reviewed and discussed with patient. No acute fractures or dislocations noted. Mild spurring noted at inferior calcaneus.  Discussed treatment options including, ice, NSAIDS, supportive shoes, bracing, and stretching. Stretching exercises provided to be done on a daily basis.   Defer injection and medication today.  Will try voltaren gel.  Follow-up 6 weeks or sooner if any problems arise. In the meantime, encouraged to call the office with any questions, concerns, change in symptoms.   Louann Sjogren, DPM

## 2021-12-29 NOTE — Patient Instructions (Signed)

## 2022-01-02 ENCOUNTER — Ambulatory Visit: Payer: Commercial Managed Care - PPO | Admitting: Family

## 2022-01-02 ENCOUNTER — Telehealth: Payer: Self-pay | Admitting: Family

## 2022-01-02 ENCOUNTER — Other Ambulatory Visit (INDEPENDENT_AMBULATORY_CARE_PROVIDER_SITE_OTHER): Payer: Commercial Managed Care - PPO

## 2022-01-02 DIAGNOSIS — R7989 Other specified abnormal findings of blood chemistry: Secondary | ICD-10-CM | POA: Diagnosis not present

## 2022-01-02 NOTE — Telephone Encounter (Signed)
Form in folder for completion.  

## 2022-01-02 NOTE — Telephone Encounter (Signed)
Patient dropped off form to be filled out by [facility] in bin up front  Patient would like it to be faxed to # on form [phone]

## 2022-01-03 LAB — COMPREHENSIVE METABOLIC PANEL
ALT: 24 U/L (ref 0–35)
AST: 20 U/L (ref 0–37)
Albumin: 4.3 g/dL (ref 3.5–5.2)
Alkaline Phosphatase: 132 U/L — ABNORMAL HIGH (ref 39–117)
BUN: 9 mg/dL (ref 6–23)
CO2: 25 mEq/L (ref 19–32)
Calcium: 9.2 mg/dL (ref 8.4–10.5)
Chloride: 105 mEq/L (ref 96–112)
Creatinine, Ser: 0.76 mg/dL (ref 0.40–1.20)
GFR: 101.99 mL/min (ref >60.00)
Glucose, Bld: 100 mg/dL — ABNORMAL HIGH (ref 70–99)
Potassium: 4.1 mEq/L (ref 3.5–5.1)
Sodium: 139 mEq/L (ref 135–145)
Total Bilirubin: 0.3 mg/dL (ref 0.2–1.2)
Total Protein: 7.1 g/dL (ref 6.0–8.3)

## 2022-01-03 NOTE — Telephone Encounter (Signed)
Form faxed, confirmation received.

## 2022-01-10 ENCOUNTER — Telehealth: Payer: Self-pay | Admitting: Family

## 2022-01-10 ENCOUNTER — Encounter: Payer: Self-pay | Admitting: *Deleted

## 2022-01-10 DIAGNOSIS — R7989 Other specified abnormal findings of blood chemistry: Secondary | ICD-10-CM

## 2022-01-10 NOTE — Telephone Encounter (Signed)
Patient needs future labs per her 06/13 lab results message from [facility]. Her lab appt is on 8/07.

## 2022-01-10 NOTE — Telephone Encounter (Signed)
Future lab orders placed and mychart message sent to patient to let her know.

## 2022-01-11 ENCOUNTER — Ambulatory Visit: Payer: Commercial Managed Care - PPO | Admitting: Podiatry

## 2022-02-08 ENCOUNTER — Ambulatory Visit: Payer: Commercial Managed Care - PPO | Admitting: Podiatry

## 2022-02-16 ENCOUNTER — Ambulatory Visit: Payer: Commercial Managed Care - PPO | Admitting: Podiatry

## 2022-02-23 ENCOUNTER — Ambulatory Visit: Payer: Commercial Managed Care - PPO | Admitting: Podiatry

## 2022-02-23 ENCOUNTER — Encounter: Payer: Self-pay | Admitting: Podiatry

## 2022-02-23 DIAGNOSIS — M722 Plantar fascial fibromatosis: Secondary | ICD-10-CM

## 2022-02-23 MED ORDER — DEXAMETHASONE SODIUM PHOSPHATE 120 MG/30ML IJ SOLN
4.0000 mg | Freq: Once | INTRAMUSCULAR | Status: AC
  Administered 2022-02-23: 4 mg via INTRA_ARTICULAR

## 2022-02-23 NOTE — Progress Notes (Signed)
  Subjective:  Patient ID: Shelia Delgado, female    DOB: 06-12-1986,   MRN: [id number]  No chief complaint on file.   36 y.o. female presents for concern of bilateral plantar fasciitis.  Relates pain is better but still does have some present. Pain is not as constant. Has been wearing [place]. Has not been using voltaren gel because was not sure of dose.  . Denies any other pedal complaints. Denies n/v/f/c.   Past Medical History:  Diagnosis Date   Allergy    COVID-19 2020   no vaccine   Dyshidrotic dermatitis    Medical history non-contributory    Overweight     Objective:  Physical Exam: Vascular: DP/PT pulses 2/4 bilateral. CFT <3 seconds. Normal hair growth on digits. No edema.  Skin. No lacerations or abrasions bilateral feet.  Musculoskeletal: MMT 5/5 bilateral lower extremities in DF, PF, Inversion and Eversion. Deceased ROM in DF of ankle joint. Tender to medial calcaneal tubercle on bilateral. More so on the left.  No pain along achilles, PT or arch. No pain with calcaneal squeeze.  Neurological: Sensation intact to light touch.   Assessment:   1. Plantar fasciitis, bilateral       Plan:  Patient was evaluated and treated and all questions answered. Discussed plantar fasciitis with patient.  X-rays reviewed and discussed with patient. No acute fractures or dislocations noted. Mild spurring noted at inferior calcaneus.  Discussed treatment options including, ice, NSAIDS, supportive shoes, bracing, and stretching. Stretching exercises provided to be done on a daily basis.   Injection offered today. Procedure below.  Continue voltaren as needed.  Follow-up 6 weeks or sooner if any problems arise. In the meantime, encouraged to call the office with any questions, concerns, change in symptoms.    Procedure: Injection Tendon/Ligament Discussed alternatives, risks, complications and verbal consent was obtained.  Location: Left plantar fascia . Skin Prep:  Alcohol. Injectate: 1cc 0.5% marcaine plain, 1 cc dexamethasone.  Disposition: Patient tolerated procedure well. Injection site dressed with a band-aid.  Post-injection care was discussed and return precautions discussed.   Louann Sjogren, DPM

## 2022-02-26 ENCOUNTER — Other Ambulatory Visit: Payer: Commercial Managed Care - PPO

## 2022-02-27 ENCOUNTER — Telehealth: Payer: Self-pay | Admitting: *Deleted

## 2022-02-27 NOTE — Telephone Encounter (Signed)
Returned call from 02/26/2022 at 1:16 PM. Left patient a message to call and schedule annual after 03/21/2022, starting on 03/23/2022.

## 2022-02-28 ENCOUNTER — Other Ambulatory Visit (INDEPENDENT_AMBULATORY_CARE_PROVIDER_SITE_OTHER): Payer: Commercial Managed Care - PPO

## 2022-02-28 DIAGNOSIS — R7989 Other specified abnormal findings of blood chemistry: Secondary | ICD-10-CM

## 2022-03-01 ENCOUNTER — Encounter: Payer: Self-pay | Admitting: Family

## 2022-03-01 LAB — COMPREHENSIVE METABOLIC PANEL
ALT: 25 U/L (ref 0–35)
AST: 21 U/L (ref 0–37)
Albumin: 4.4 g/dL (ref 3.5–5.2)
Alkaline Phosphatase: 125 U/L — ABNORMAL HIGH (ref 39–117)
BUN: 12 mg/dL (ref 6–23)
CO2: 24 mEq/L (ref 19–32)
Calcium: 9.5 mg/dL (ref 8.4–10.5)
Chloride: 103 mEq/L (ref 96–112)
Creatinine, Ser: 0.78 mg/dL (ref 0.40–1.20)
GFR: 98.76 mL/min (ref >60.00)
Glucose, Bld: 109 mg/dL — ABNORMAL HIGH (ref 70–99)
Potassium: 4.1 mEq/L (ref 3.5–5.1)
Sodium: 138 mEq/L (ref 135–145)
Total Bilirubin: 0.3 mg/dL (ref 0.2–1.2)
Total Protein: 7.2 g/dL (ref 6.0–8.3)

## 2022-03-23 ENCOUNTER — Encounter: Payer: Self-pay | Admitting: Podiatry

## 2022-04-05 ENCOUNTER — Ambulatory Visit: Payer: Commercial Managed Care - PPO | Admitting: Podiatry

## 2022-04-23 ENCOUNTER — Encounter: Payer: Self-pay | Admitting: Family Medicine

## 2022-04-23 ENCOUNTER — Ambulatory Visit: Payer: Commercial Managed Care - PPO | Admitting: Family Medicine

## 2022-04-23 VITALS — BP 110/70 | HR 77 | Temp 97.9°F | Resp 12 | Ht 64.0 in | Wt 222.5 lb

## 2022-04-23 DIAGNOSIS — R519 Headache, unspecified: Secondary | ICD-10-CM | POA: Diagnosis not present

## 2022-04-23 DIAGNOSIS — R197 Diarrhea, unspecified: Secondary | ICD-10-CM | POA: Diagnosis not present

## 2022-04-23 NOTE — Progress Notes (Signed)
ACUTE VISIT Chief Complaint  Patient presents with   stomach issues   Headache   HPI: [name] is a 36 y.o. female, who is here today complaining of 4 days of diarrhea and headache. Until yesterday she was having > 5 stools daily, no mucus or blood. 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 Today she has had a stool, formed.  Diarrhea  This is a new problem. The current episode started in the past 7 days. The problem has been gradually improving. The patient states that diarrhea does not awaken her from sleep. Associated symptoms include chills, a fever, headaches and myalgias. Pertinent negatives include no abdominal pain, arthralgias, bloating, coughing, increased  flatus, sweats or vomiting. She has tried nothing for the symptoms. Her past medical history is significant for irritable bowel syndrome.  She had body aches, low grade fever, max temp 99.6 F, and chills. No recent travel or abx use.  She has had elevated alk phophatase, no abdominal pain or nausea. Lab Results  Component Value Date   ALT 25 02/28/2022   AST 21 02/28/2022   ALKPHOS 125 (H) 02/28/2022   BILITOT 0.3 02/28/2022   Lab Results  Component Value Date   CREATININE 0.78 02/28/2022   BUN 12 02/28/2022   NA 138 02/28/2022   K 4.1 02/28/2022   CL 103 02/28/2022   CO2 24 02/28/2022   Frontal pressure headache, improved. Last night neck pain and occipital headache. Mild rhinorrhea and nasal congestion. She took Ibuprofen today.  Review of Systems  Constitutional:  Positive for chills, fatigue and fever.  HENT:  Positive for postnasal drip. Negative for sore throat.   Respiratory:  Negative for cough, shortness of breath and wheezing.   Gastrointestinal:  Positive for diarrhea. Negative for abdominal pain, bloating, flatus and vomiting.  Musculoskeletal:  Positive for myalgias. Negative for arthralgias.  Neurological:  Positive for headaches. Negative for syncope and weakness.   Rest see pertinent positives and negatives per HPI.  Current Outpatient Medications on File Prior to Visit  Medication Sig Dispense Refill   docusate sodium (COLACE) 100 MG capsule Take 100 mg by mouth daily as needed.     fluticasone (FLONASE) 50 MCG/ACT nasal spray Place 2 sprays into both nostrils daily. 16 g 0   norethindrone (MICRONOR) 0.35 MG tablet Take 1 tablet (0.35 mg total) by mouth daily. 84 tablet 3   Prenatal w/o A Vit-Fe Fum-FA (PRENATAL VITAMIN W/FE, FA) 29-1 MG CHEW Chew 1 tablet by mouth daily. 90 tablet 3   No current facility-administered medications on file prior to visit.   Past Medical History:  Diagnosis Date   Allergy    COVID-19 2020   no vaccine   Dyshidrotic dermatitis    Medical history non-contributory    Overweight    Allergies  Allergen Reactions   Amoxicillin Rash    Mild rash after taking for 1 week   Social History   Socioeconomic History   Marital status: Married    Spouse name: Not on file   Number of children: Not on file   Years of education: Not on file   Highest education level: Not on file  Occupational History   Not on file  Tobacco Use   Smoking status: Never    Passive exposure: Never   Smokeless tobacco: Never  Vaping Use   Vaping Use: Never used  Substance and Sexual Activity   Alcohol use: Yes    Comment: occ   Drug  use: Not Currently   Sexual activity: Yes    Birth control/protection: Pill  Other Topics Concern   Not on file  Social History Narrative   Not on file   Social Determinants of Health   Financial Resource Strain: Not on file  Food Insecurity: Not on file  Transportation Needs: Not on file  Physical Activity: Not on file  Stress: Not on file  Social Connections: Not on file   Vitals:   04/23/22 1552  BP: 110/70  Pulse: 77  Resp: 12  Temp: 97.9 F (36.6 C)  SpO2: 98%   Body mass index is 38.19 kg/m.  Physical Exam Vitals and nursing note reviewed.  Constitutional:      General: She  is not in acute distress.    Appearance: She is well-developed. She is not ill-appearing.  HENT:     Head: Normocephalic and atraumatic.     Right Ear: Tympanic membrane, ear canal and external ear normal.     Left Ear: Tympanic membrane, ear canal and external ear normal.     Nose: No congestion or rhinorrhea.     Right Turbinates: Enlarged.     Left Turbinates: Not enlarged.     Right Sinus: No maxillary sinus tenderness or frontal sinus tenderness.     Left Sinus: No maxillary sinus tenderness or frontal sinus tenderness.     Mouth/Throat:     Mouth: Mucous membranes are moist.     Pharynx: Oropharynx is clear.  Eyes:     Conjunctiva/sclera: Conjunctivae normal.  Cardiovascular:     Rate and Rhythm: Normal rate and regular rhythm.     Heart sounds: No murmur heard. Pulmonary:     Effort: Pulmonary effort is normal. No respiratory distress.     Breath sounds: Normal breath sounds. No stridor.  Abdominal:     Palpations: Abdomen is soft. There is no mass.     Tenderness: There is no abdominal tenderness.  Musculoskeletal:     Cervical back: Tenderness (Trapezium,bilateral.) present. No edema, erythema or bony tenderness. No muscular tenderness.  Lymphadenopathy:     Cervical: No cervical adenopathy.  Skin:    General: Skin is warm.     Findings: No erythema or rash.  Neurological:     General: No focal deficit present.     Mental Status: She is alert and oriented to person, place, and time.     Deep Tendon Reflexes:     Reflex Scores:      Patellar reflexes are 2+ on the right side and 2+ on the left side.  ASSESSMENT AND PLAN:  [name] was seen today for stomach issues and headache.  Diagnoses and all orders for this visit:  Diarrhea, unspecified type Last episode yesterday, seems resolved. We discussed possible causes, most likely viral. No further work up needed at this time. If recurrent, she needs to follow with PCP.  Headache, unspecified headache  type Improved. Frontal headache could be caused by viral illness and allergies. Occasional headache seems more tension headache. Examination today and hx do not suggest a serios process. I do not think labs or imaging are necessary at this time. Instructed about warning signs.  Return if symptoms worsen or fail to improve.  Karanvir Balderston G. Martinique, MD  [facility]. [place] office.

## 2022-05-21 ENCOUNTER — Other Ambulatory Visit (HOSPITAL_COMMUNITY)
Admission: RE | Admit: 2022-05-21 | Discharge: 2022-05-21 | Disposition: A | Discharge status: Home / Self Care | Payer: Commercial Managed Care - PPO | Source: Ambulatory Visit | Attending: Obstetrics & Gynecology | Admitting: Obstetrics & Gynecology

## 2022-05-21 ENCOUNTER — Encounter: Payer: Self-pay | Admitting: Obstetrics & Gynecology

## 2022-05-21 ENCOUNTER — Ambulatory Visit (INDEPENDENT_AMBULATORY_CARE_PROVIDER_SITE_OTHER): Payer: Commercial Managed Care - PPO | Admitting: Obstetrics & Gynecology

## 2022-05-21 VITALS — BP 120/79 | HR 86 | Resp 16 | Ht 64.0 in | Wt 224.0 lb

## 2022-05-21 DIAGNOSIS — Z01419 Encounter for gynecological examination (general) (routine) without abnormal findings: Secondary | ICD-10-CM | POA: Diagnosis present

## 2022-05-21 MED ORDER — NORETHIN ACE-ETH ESTRAD-FE 1-20 MG-MCG(24) PO TABS: 1.0000 | ORAL_TABLET | Freq: Every day | ORAL | 11 refills | Status: DC

## 2022-05-21 NOTE — Progress Notes (Signed)
Subjective:     Shelia Delgado is a 36 y.o. female here for a routine exam.  Current complaints: wants to lose weight and plantar fascitis. .     Gynecologic History Patient's last menstrual period was 04/21/2022. Contraception: oral progesterone-only contraceptive Last Pap: 2020. Results were: normal Last mammogram: never.   Obstetric History OB History  Gravida Para Term Preterm AB Living  1 1 1  0 0 1  SAB IAB Ectopic Multiple Live Births  0 0 0 0 1    # Outcome Date GA Lbr Len/2nd Weight Sex Delivery Anes PTL Lv  1 Term 02/21/21 [redacted]w[redacted]d 04:08 / 00:23 7 lb 5.5 oz (3.331 kg) F Vag-Vacuum Local  LIV     Birth Comments: wnl     The following portions of the patient's history were reviewed and updated as appropriate: allergies, current medications, past family history, past medical history, past social history, past surgical history, and problem list.  Review of Systems Pertinent items noted in HPI and remainder of comprehensive ROS otherwise negative.    Objective:     Vitals:   05/21/22 1559  BP: 120/79  Pulse: 86  Resp: 16  Weight: 224 lb (101.6 kg)  Height: 5\' 4"  (1.626 m)   Vitals:  WNL General appearance: alert, cooperative and no distress  HEENT: Normocephalic, without obvious abnormality, atraumatic Eyes: negative Throat: lips, mucosa, and tongue normal; teeth and gums normal  Respiratory: Clear to auscultation bilaterally  CV: Regular rate and rhythm  Breasts:  Normal appearance, no masses or tenderness, no nipple retraction or dimpling  GI: Soft, non-tender; bowel sounds normal; no masses,  no organomegaly  GU: External Genitalia:  Tanner V, no lesion Urethra:  No prolapse   Vagina: Pink, normal rugae, no blood or discharge  Cervix: No CMT, ectropion that bled slightly after pap  Uterus:  Normal size and contour, non tender  Adnexa: Normal, no masses, non tender  Musculoskeletal: No edema, redness or tenderness in the calves or thighs  Skin: No lesions  or rash  Lymphatic: Axillary adenopathy: none     Psychiatric: Normal mood and behavior        Assessment:    Healthy female exam.    Plan:     Pap with co testing Change form pops to combination OCPs Weight loss strategies reviewed Reviewed exercises for plantar fascitis.

## 2022-05-23 LAB — CYTOLOGY - PAP
Comment: NEGATIVE
Diagnosis: NEGATIVE
High risk HPV: NEGATIVE

## 2022-06-08 ENCOUNTER — Ambulatory Visit (INDEPENDENT_AMBULATORY_CARE_PROVIDER_SITE_OTHER): Payer: Commercial Managed Care - PPO | Admitting: Family

## 2022-06-08 VITALS — BP 108/60 | HR 92 | Temp 97.9°F | Resp 18 | Ht 64.0 in | Wt 225.2 lb

## 2022-06-08 DIAGNOSIS — R002 Palpitations: Secondary | ICD-10-CM | POA: Diagnosis not present

## 2022-06-08 NOTE — Progress Notes (Signed)
  Shelia Delgado is a 36 y.o. female with the following history as recorded in EpicCare:  Patient Active Problem List   Diagnosis Date Noted   Chronic sore throat 08/24/2021   History of gestational hypertension 03/21/2021   BMI 38.0-38.9,adult 01/26/2019   Dyshidrotic dermatitis     Current Outpatient Medications  Medication Sig Dispense Refill   fluticasone (FLONASE) 50 MCG/ACT nasal spray Place 2 sprays into both nostrils daily. 16 g 0   Norethindrone Acetate-Ethinyl Estrad-FE (LOESTRIN 24 FE) 1-20 MG-MCG(24) tablet Take 1 tablet by mouth daily. 28 tablet 11   No current facility-administered medications for this visit.    Allergies: Amoxicillin  Past Medical History:  Diagnosis Date   Allergy    COVID-19 2020   no vaccine   Dyshidrotic dermatitis    Overweight     Past Surgical History:  Procedure Laterality Date   WISDOM TOOTH EXTRACTION      Family History  Problem Relation Age of Onset   Hypertension Mother    Skin cancer Mother    Hypertension Father    Atrial fibrillation Father    Diabetes Father    Diabetes Maternal Grandfather    Lung disease Maternal Grandfather    Heart attack Paternal Grandfather    Heart attack Paternal Uncle    Healthy Brother     Social History   Tobacco Use   Smoking status: Never    Passive exposure: Never   Smokeless tobacco: Never  Substance Use Topics   Alcohol use: Yes    Comment: occ    Subjective:  Patient presents with concerns for increasing problems with palpitations; notes symptoms have been present since she was a teenager; feels like symptoms have been more frequently lately- more noticeable in the past 4-6 months;      Objective:  Vitals:   06/08/22 1353  BP: 108/60  Pulse: 92  Resp: 18  Temp: 97.9 F (36.6 C)  TempSrc: Temporal  SpO2: 97%  Weight: 225 lb 3.2 oz (102.2 kg)  Height: 5\' 4"  (1.626 m)    General: Well developed, well nourished, in no acute distress  Skin : Warm and dry.  Head:  Normocephalic and atraumatic  Eyes: Sclera and conjunctiva clear; pupils round and reactive to light; extraocular movements intact  Ears: External normal; canals clear; tympanic membranes normal  Oropharynx: Pink, supple. No suspicious lesions  Neck: Supple without thyromegaly, adenopathy  Lungs: Respirations unlabored; clear to auscultation bilaterally without wheeze, rales, rhonchi  CVS exam: normal rate and regular rhythm.  Neurologic: Alert and oriented; speech intact; face symmetrical; moves all extremities well; CNII-XII intact without focal deficit   Assessment:  1. Palpitations     Plan:   EKG shows sinus rhythm; labs checked 5 months ago were normal- symptoms were present at that time; encouraged to stay hydrated; will refer to cardiology for holter monitor;   No follow-ups on file.  Orders Placed This Encounter  Procedures   Ambulatory referral to Cardiology    Referral Priority:   Routine    Referral Type:   Consultation    Referral Reason:   Specialty Services Required    Requested Specialty:   Cardiology    Number of Visits Requested:   1   EKG 12-Lead    Requested Prescriptions    No prescriptions requested or ordered in this encounter

## 2022-06-13 ENCOUNTER — Ambulatory Visit: Payer: Commercial Managed Care - PPO | Attending: Internal Medicine | Admitting: Internal Medicine

## 2022-06-13 ENCOUNTER — Encounter: Payer: Self-pay | Admitting: Internal Medicine

## 2022-06-13 VITALS — BP 112/77 | HR 78 | Ht 64.0 in | Wt 225.0 lb

## 2022-06-13 DIAGNOSIS — R002 Palpitations: Secondary | ICD-10-CM | POA: Diagnosis not present

## 2022-06-13 NOTE — Progress Notes (Signed)
Cardiology Office Note:    Date:  06/13/2022   ID:  Shelia Delgado, DOB August 25, 1985, MRN [id number]  PCP:  Olive Bass, FNP   [facility] Providers Cardiologist:  None     Referring MD: Olive Bass,*   No chief complaint on file. Palpitations  History of Present Illness:    Shelia Delgado is a 36 y.o. female with a hx of COVID, reports of palpitations. She feels it sometimes.  No presyncopal or syncope. Noticed as a teenager, now more often. She was monitored. No tx. TSH nl. Nl hgb. On prenatals. No hx of syncope. No family hx of SCD. No pregnant. Has a toddler  Past Medical History:  Diagnosis Date   Allergy    COVID-19 2020   no vaccine   Dyshidrotic dermatitis    Overweight     Past Surgical History:  Procedure Laterality Date   WISDOM TOOTH EXTRACTION      Current Medications: Current Meds  Medication Sig   fluticasone (FLONASE) 50 MCG/ACT nasal spray Place 2 sprays into both nostrils daily.   Norethindrone Acetate-Ethinyl Estrad-FE (LOESTRIN 24 FE) 1-20 MG-MCG(24) tablet Take 1 tablet by mouth daily.     Allergies:   Amoxicillin   Social History   Socioeconomic History   Marital status: Married    Spouse name: Not on file   Number of children: Not on file   Years of education: Not on file   Highest education level: Not on file  Occupational History   Not on file  Tobacco Use   Smoking status: Never    Passive exposure: Never   Smokeless tobacco: Never  Vaping Use   Vaping Use: Never used  Substance and Sexual Activity   Alcohol use: Yes    Comment: occ   Drug use: Not Currently   Sexual activity: Yes    Birth control/protection: Pill  Other Topics Concern   Not on file  Social History Narrative   Not on file   Social Determinants of Health   Financial Resource Strain: Not on file  Food Insecurity: Not on file  Transportation Needs: Not on file  Physical Activity: Not on file  Stress: Not on file  Social  Connections: Not on file     Family History: The patient's family history includes Atrial fibrillation in her father; Diabetes in her father and maternal grandfather; Healthy in her brother; Heart attack in her paternal grandfather and paternal uncle; Hypertension in her father and mother; Lung disease in her maternal grandfather; Skin cancer in her mother.  MGF/MGM- MI Paternal Uncle- CABG Father- ?cardiomegaly  ROS:   Please see the history of present illness.     All other systems reviewed and are negative.  EKGs/Labs/Other Studies Reviewed:    The following studies were reviewed today:   EKG:  EKG is  ordered today.  The ekg ordered today demonstrates   06/13/2022- NSR  Recent Labs: 12/15/2021: Hemoglobin 12.8; Platelets 322 12/19/2021: TSH 1.71 02/28/2022: ALT 25; BUN 12; Creatinine, Ser 0.78; Potassium 4.1; Sodium 138  Recent Lipid Panel    Component Value Date/Time   CHOL 154 12/19/2021 1345   TRIG 181.0 (H) 12/19/2021 1345   HDL 45.20 12/19/2021 1345   CHOLHDL 3 12/19/2021 1345   VLDL 36.2 12/19/2021 1345   LDLCALC 73 12/19/2021 1345   LDLCALC 91 12/04/2019 1000     Risk Assessment/Calculations:         Physical Exam:    VS:  Vitals:  06/13/22 1328  BP: 112/77  Pulse: 78  SpO2: 98%     Wt Readings from Last 3 Encounters:  06/13/22 225 lb (102.1 kg)  06/08/22 225 lb 3.2 oz (102.2 kg)  05/21/22 224 lb (101.6 kg)     GEN:  Well nourished, well developed in no acute distress HEENT: Normal NECK: No JVD; No carotid bruits LYMPHATICS: No lymphadenopathy CARDIAC: RRR, no murmurs, rubs, gallops RESPIRATORY:  Clear to auscultation without rales, wheezing or rhonchi  ABDOMEN: Soft, non-tender, non-distended MUSCULOSKELETAL:  No edema; No deformity  SKIN: Warm and dry NEUROLOGIC:  Alert and oriented x 3 PSYCHIATRIC:  Normal affect   ASSESSMENT:    Palpitations: She does not have high risk features including syncope c/f arrhythmia , family hx of SCD,  or abnormalities on her EKGs.   We discussed cutting back on caffeine, and stress relief. No further cardiac work up is warranted at this time.   PLAN:    In order of problems listed above:  No further cardiac w/u       Medication Adjustments/Labs and Tests Ordered: Current medicines are reviewed at length with the patient today.  Concerns regarding medicines are outlined above.  No orders of the defined types were placed in this encounter.  No orders of the defined types were placed in this encounter.   Patient Instructions  Medication Instructions:  No change   Lab Work: None ordered   Testing/Procedures: None ordered   Follow-Up: At [facility], you and your health needs are our priority.  As part of our continuing mission to provide you with exceptional heart care, we have created designated Provider Care Teams.  These Care Teams include your primary Cardiologist (physician) and Advanced Practice Providers (APPs -  Physician Assistants and Nurse Practitioners) who all work together to provide you with the care you need, when you need it.  We recommend signing up for the patient portal called "MyChart".  Sign up information is provided on this After Visit Summary.  MyChart is used to connect with patients for Virtual Visits (Telemedicine).  Patients are able to view lab/test results, encounter notes, upcoming appointments, etc.  Non-urgent messages can be sent to your provider as well.   To learn more about what you can do with MyChart, go to [email or web address].    Your next appointment:  As Needed    The format for your next appointment: Office   Provider:  Dr.Ziere Docken   Important Information About Sugar         Signed, Maisie Fus, MD  06/13/2022 2:09 PM    [facility]

## 2022-06-13 NOTE — Patient Instructions (Signed)
Medication Instructions:  No change   Lab Work: None ordered   Testing/Procedures: None ordered   Follow-Up: At [facility], you and your health needs are our priority.  As part of our continuing mission to provide you with exceptional heart care, we have created designated Provider Care Teams.  These Care Teams include your primary Cardiologist (physician) and Advanced Practice Providers (APPs -  Physician Assistants and Nurse Practitioners) who all work together to provide you with the care you need, when you need it.  We recommend signing up for the patient portal called "MyChart".  Sign up information is provided on this After Visit Summary.  MyChart is used to connect with patients for Virtual Visits (Telemedicine).  Patients are able to view lab/test results, encounter notes, upcoming appointments, etc.  Non-urgent messages can be sent to your provider as well.   To learn more about what you can do with MyChart, go to [email or web address].    Your next appointment:  As Needed    The format for your next appointment: Office     Provider:  Dr.Branch   Important Information About Sugar       

## 2022-06-18 ENCOUNTER — Encounter: Payer: Self-pay | Admitting: Family

## 2022-06-19 ENCOUNTER — Telehealth: Payer: Self-pay

## 2022-06-19 ENCOUNTER — Ambulatory Visit: Payer: Commercial Managed Care - PPO | Attending: Internal Medicine

## 2022-06-19 DIAGNOSIS — R002 Palpitations: Secondary | ICD-10-CM

## 2022-06-19 NOTE — Progress Notes (Unsigned)
Enrolled patient for a 7 day Zio XT monitor to be mailed to patients home.  

## 2022-06-19 NOTE — Telephone Encounter (Signed)
Office contacted by patient PCP as patient waning a monitor for her palpitations. Reviewed request with provider and agreed to order 7 day monitor.  Order placed.

## 2022-06-24 ENCOUNTER — Encounter: Payer: Self-pay | Admitting: Obstetrics & Gynecology

## 2022-06-25 ENCOUNTER — Other Ambulatory Visit: Payer: Self-pay

## 2022-06-25 MED ORDER — NORETHIN ACE-ETH ESTRAD-FE 1-20 MG-MCG(24) PO TABS: 1.0000 | ORAL_TABLET | Freq: Every day | ORAL | 11 refills | Status: DC

## 2022-06-25 NOTE — Progress Notes (Signed)
[facility] Rx sent to mail order pharmacy per pt request

## 2022-06-26 DIAGNOSIS — R002 Palpitations: Secondary | ICD-10-CM | POA: Diagnosis not present

## 2022-07-09 ENCOUNTER — Encounter: Payer: Self-pay | Admitting: Family Medicine

## 2022-07-09 ENCOUNTER — Ambulatory Visit: Payer: Commercial Managed Care - PPO | Admitting: Family Medicine

## 2022-07-09 VITALS — BP 103/57 | HR 100 | Temp 98.1°F | Ht 64.0 in | Wt 222.6 lb

## 2022-07-09 DIAGNOSIS — J0140 Acute pansinusitis, unspecified: Secondary | ICD-10-CM

## 2022-07-09 DIAGNOSIS — R051 Acute cough: Secondary | ICD-10-CM

## 2022-07-09 DIAGNOSIS — R197 Diarrhea, unspecified: Secondary | ICD-10-CM

## 2022-07-09 MED ORDER — DOXYCYCLINE HYCLATE 100 MG PO TABS
100.0000 mg | ORAL_TABLET | Freq: Two times a day (BID) | ORAL | 0 refills | Status: AC
Start: 2022-07-09 — End: 2022-07-19

## 2022-07-09 MED ORDER — BENZONATATE 200 MG PO CAPS: 200.0000 mg | ORAL_CAPSULE | Freq: Two times a day (BID) | ORAL | 0 refills | Status: DC | PRN

## 2022-07-09 MED ORDER — HYDROCOD POLI-CHLORPHE POLI ER 10-8 MG/5ML PO SUER: 5.0000 mL | Freq: Two times a day (BID) | ORAL | 0 refills | Status: DC | PRN

## 2022-07-09 NOTE — Patient Instructions (Addendum)
COVID negative Given duration of cough and discolored sputum, starting antibiotics.  Your lungs sounded good today, but if you do not start improving with antibiotics, let's get a chest xray.  Adding as needed Tessalon and Tussionex for cough.  Continue supportive measures including rest, hydration, humidifier use, steam showers, warm compresses to sinuses, warm liquids with lemon and honey, and over-the-counter cough, cold, and analgesics as needed.   Please contact office for follow-up if symptoms do not improve or worsen. Seek emergency care if symptoms become severe.

## 2022-07-09 NOTE — Progress Notes (Signed)
Acute Office Visit  Subjective:     Patient ID: Shelia Delgado, female    DOB: 14-Jun-1986, 36 y.o.   MRN: [id number]  Chief Complaint  Patient presents with   Cough    Onset 3 weeks but seems to be getting worse  Stomach bug- diarrhea     Cough   Patient is in today for ongoing cough.   Patient reports symptoms started about 3 weeks ago as a cold and now has progressed to a lingering cough with productive yellow/green sputum.  States she will get occasional wheezy and does feel tired.  She has not had any shortness of breath, chest pain, fevers, chills, loss of taste or smell.  She has been managing with over-the-counter medicines but is having trouble sleeping due to the cough.  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     Review of Systems  Respiratory:  Positive for cough.    All review of systems negative except what is listed in the HPI      Objective:    BP (!) 103/57 (BP Location: Right Arm, Patient Position: Sitting, Cuff Size: Large)   Pulse 100   Temp 98.1 F (36.7 C) (Oral)   Ht 5\' 4"  (1.626 m)   Wt 222 lb 9.6 oz (101 kg)   LMP 06/26/2022 (Exact Date)   SpO2 97%   Breastfeeding No   BMI 38.21 kg/m    Physical Exam Vitals reviewed.  Constitutional:      Appearance: Normal appearance.  Cardiovascular:     Rate and Rhythm: Normal rate and regular rhythm.     Pulses: Normal pulses.     Heart sounds: Normal heart sounds.  Pulmonary:     Effort: Pulmonary effort is normal.     Breath sounds: Normal breath sounds. No wheezing, rhonchi or rales.  Skin:    General: Skin is warm and dry.  Neurological:     Mental Status: She is alert and oriented to person, place, and time.  Psychiatric:        Mood and Affect: Mood normal.        Behavior: Behavior normal.        Thought Content: Thought content normal.        Judgment: Judgment normal.     No results found for any visits  on 07/09/22.      Assessment & Plan:   Problem List Items Addressed This Visit   None Visit Diagnoses     Acute cough    -  Primary   Relevant Medications   chlorpheniramine-HYDROcodone (TUSSIONEX) 10-8 MG/5ML   benzonatate (TESSALON) 200 MG capsule   doxycycline (VIBRA-TABS) 100 MG tablet   Acute non-recurrent pansinusitis     COVID negative Given duration of cough and discolored sputum, starting antibiotics.  Your lungs sounded good today, but if you do not start improving with antibiotics, let's get a chest xray.  Adding as needed Tessalon and Tussionex for cough.  Continue supportive measures including rest, hydration, humidifier use, steam showers, warm compresses to sinuses, warm liquids with lemon and honey, and over-the-counter cough, cold, and analgesics as needed.     Relevant Medications   chlorpheniramine-HYDROcodone (TUSSIONEX) 10-8 MG/5ML   benzonatate (TESSALON) 200 MG capsule   doxycycline (VIBRA-TABS) 100 MG tablet   Diarrhea, unspecified type     COVID negative Continue supportive measures and BRAT diet        Meds  ordered this encounter  Medications   chlorpheniramine-HYDROcodone (TUSSIONEX) 10-8 MG/5ML    Sig: Take 5 mLs by mouth every 12 (twelve) hours as needed for up to 5 days.    Dispense:  50 mL    Refill:  0    Order Specific Question:   Supervising Provider    Answer:   Danise Edge A [[id number]]   benzonatate (TESSALON) 200 MG capsule    Sig: Take 1 capsule (200 mg total) by mouth 2 (two) times daily as needed for cough.    Dispense:  20 capsule    Refill:  0    Order Specific Question:   Supervising Provider    Answer:   Danise Edge A [[id number]]   doxycycline (VIBRA-TABS) 100 MG tablet    Sig: Take 1 tablet (100 mg total) by mouth 2 (two) times daily for 10 days.    Dispense:  20 tablet    Refill:  0    Order Specific Question:   Supervising Provider    Answer:   Danise Edge A [[id number]]    Return if symptoms worsen or fail to  improve.  Clayborne Dana, NP

## 2022-07-10 MED ORDER — HYDROCOD POLI-CHLORPHE POLI ER 10-8 MG/5ML PO SUER: 5.0000 mL | Freq: Two times a day (BID) | ORAL | 0 refills | Status: AC | PRN

## 2022-07-10 NOTE — Telephone Encounter (Signed)
[facility] has deleted the Rx.

## 2022-07-13 ENCOUNTER — Telehealth: Payer: Self-pay | Admitting: Internal Medicine

## 2022-07-13 NOTE — Telephone Encounter (Signed)
LMTCB

## 2022-07-13 NOTE — Telephone Encounter (Signed)
Patient returned RN's call regarding results. 

## 2022-07-18 ENCOUNTER — Telehealth: Payer: Self-pay | Admitting: Internal Medicine

## 2022-07-18 NOTE — Telephone Encounter (Signed)
Shelia Fus, MD 07/12/2022 10:33 AM EST     Hi Eileen Stanford, please let [name] know that she has rare skipped heart beats. This can be managed with medicine. Please start propanolol 20 mg BID PRN for palpitations. Also recommend cutting back on caffeine if applicable. Hydrate with electrolytes    Patient states the palpitations do not bother her and would prefer to hold off on sending prescription at this time.  She will let us know if this worsens or becomes symptomatic.

## 2022-07-18 NOTE — Telephone Encounter (Signed)
Pt returning call for monitor results  

## 2022-07-18 NOTE — Telephone Encounter (Signed)
See duplicate message.  ?

## 2022-08-03 ENCOUNTER — Encounter: Payer: Self-pay | Admitting: Family

## 2022-08-03 ENCOUNTER — Other Ambulatory Visit: Payer: Self-pay | Admitting: Family

## 2022-08-03 ENCOUNTER — Ambulatory Visit (HOSPITAL_BASED_OUTPATIENT_CLINIC_OR_DEPARTMENT_OTHER)
Admission: RE | Admit: 2022-08-03 | Discharge: 2022-08-03 | Disposition: A | Discharge status: Home / Self Care | Payer: Commercial Managed Care - PPO | Source: Ambulatory Visit | Attending: Family | Admitting: Family

## 2022-08-03 ENCOUNTER — Ambulatory Visit: Payer: Commercial Managed Care - PPO | Admitting: Family

## 2022-08-03 VITALS — BP 122/74 | HR 95 | Temp 97.7°F | Ht 64.0 in | Wt 225.6 lb

## 2022-08-03 DIAGNOSIS — R053 Chronic cough: Secondary | ICD-10-CM | POA: Diagnosis present

## 2022-08-03 DIAGNOSIS — J9801 Acute bronchospasm: Secondary | ICD-10-CM | POA: Diagnosis not present

## 2022-08-03 DIAGNOSIS — J209 Acute bronchitis, unspecified: Secondary | ICD-10-CM

## 2022-08-03 MED ORDER — PREDNISONE 20 MG PO TABS: ORAL_TABLET | ORAL | 0 refills | Status: DC

## 2022-08-03 MED ORDER — AZITHROMYCIN 250 MG PO TABS: ORAL_TABLET | ORAL | 0 refills | Status: DC

## 2022-08-03 NOTE — Progress Notes (Signed)
Shelia Delgado is a 37 y.o. female with the following history as recorded in EpicCare:  Patient Active Problem List   Diagnosis Date Noted   Chronic sore throat 08/24/2021   History of gestational hypertension 03/21/2021   BMI 38.0-38.9,adult 01/26/2019   Dyshidrotic dermatitis     Current Outpatient Medications  Medication Sig Dispense Refill   azithromycin (ZITHROMAX Z-PAK) 250 MG tablet Take 2 tablets (500 mg) PO today, then 1 tablet (250 mg) PO daily x4 days. 6 tablet 0   predniSONE (DELTASONE) 20 MG tablet Take 2 tablets po qd x 2 days, then 1 tablet po qd x 5 days 9 tablet 0   benzonatate (TESSALON) 200 MG capsule Take 1 capsule (200 mg total) by mouth 2 (two) times daily as needed for cough. 20 capsule 0   fluticasone (FLONASE) 50 MCG/ACT nasal spray Place 2 sprays into both nostrils daily. 16 g 0   Norethindrone Acetate-Ethinyl Estrad-FE (LOESTRIN 24 FE) 1-20 MG-MCG(24) tablet Take 1 tablet by mouth daily. 28 tablet 11   No current facility-administered medications for this visit.    Allergies: Amoxicillin  Past Medical History:  Diagnosis Date   Allergy    COVID-19 2020   no vaccine   Dyshidrotic dermatitis    Overweight     Past Surgical History:  Procedure Laterality Date   WISDOM TOOTH EXTRACTION      Family History  Problem Relation Age of Onset   Hypertension Mother    Skin cancer Mother    Hypertension Father    Atrial fibrillation Father    Diabetes Father    Diabetes Maternal Grandfather    Lung disease Maternal Grandfather    Heart attack Paternal Grandfather    Heart attack Paternal Uncle    Healthy Brother     Social History   Tobacco Use   Smoking status: Never    Passive exposure: Never   Smokeless tobacco: Never  Substance Use Topics   Alcohol use: Yes    Comment: occ    Subjective:   Seen for cough symptoms approximately 3 weeks ago; mother is present for OV; was treated with Doxycycline, Tessalon Perles and Hycodan cough syrup; is  concerned about lingering cough and would like to get the CXR was discussed at that office visit; also requesting paperwork to be completed because she could not attend a show last night and would like to get her ticket price refunded; no fever; describes barky cough; she feels like she may actually be getting a second infection and doesn't feel that the first infection cleared completely;   LMP- this past week;    Objective:  Vitals:   08/03/22 1300  BP: 122/74  Pulse: 95  Temp: 97.7 F (36.5 C)  TempSrc: Oral  SpO2: 99%  Weight: 225 lb 9.6 oz (102.3 kg)  Height: 5\' 4"  (1.626 m)    General: Well developed, well nourished, in no acute distress  Skin : Warm and dry.  Head: Normocephalic and atraumatic  Eyes: Sclera and conjunctiva clear; pupils round and reactive to light; extraocular movements intact  Ears: External normal; canals clear; tympanic membranes normal  Oropharynx: Pink, supple. No suspicious lesions  Neck: Supple without thyromegaly, adenopathy  Lungs: Respirations unlabored; clear to auscultation bilaterally without wheeze, rales, rhonchi  CVS exam: normal rate and regular rhythm.  Neurologic: Alert and oriented; speech intact; face symmetrical; moves all extremities well; CNII-XII intact without focal deficit   Assessment:  1. Persistent cough for 3 weeks or longer  2. Acute bronchitis, unspecified organism   3. Acute bronchospasm     Plan:  Will update CXR per patient request; Rx for Z-pak for suspected new infection; Rx for prednisone to help with persisting cough; increase fluids, rest and follow up worse, no better.   No follow-ups on file.  Orders Placed This Encounter  Procedures   DG Chest 2 View    Standing Status:   Future    Number of Occurrences:   1    Standing Expiration Date:   08/04/2023    Order Specific Question:   Reason for Exam (SYMPTOM  OR DIAGNOSIS REQUIRED)    Answer:   persistent cough    Order Specific Question:   Is patient pregnant?     Answer:   No    Order Specific Question:   Preferred imaging location?    Answer:   [facility]    Requested Prescriptions   Signed Prescriptions Disp Refills   azithromycin (ZITHROMAX Z-PAK) 250 MG tablet 6 tablet 0    Sig: Take 2 tablets (500 mg) PO today, then 1 tablet (250 mg) PO daily x4 days.   predniSONE (DELTASONE) 20 MG tablet 9 tablet 0    Sig: Take 2 tablets po qd x 2 days, then 1 tablet po qd x 5 days

## 2022-08-05 IMAGING — DX DG CHEST 2V
2 series · 2 of 2 positions shown · non-contrast
Comparison: None Available.

CLINICAL DATA: Cough

EXAM:
CHEST - 2 VIEW

[chest pa]
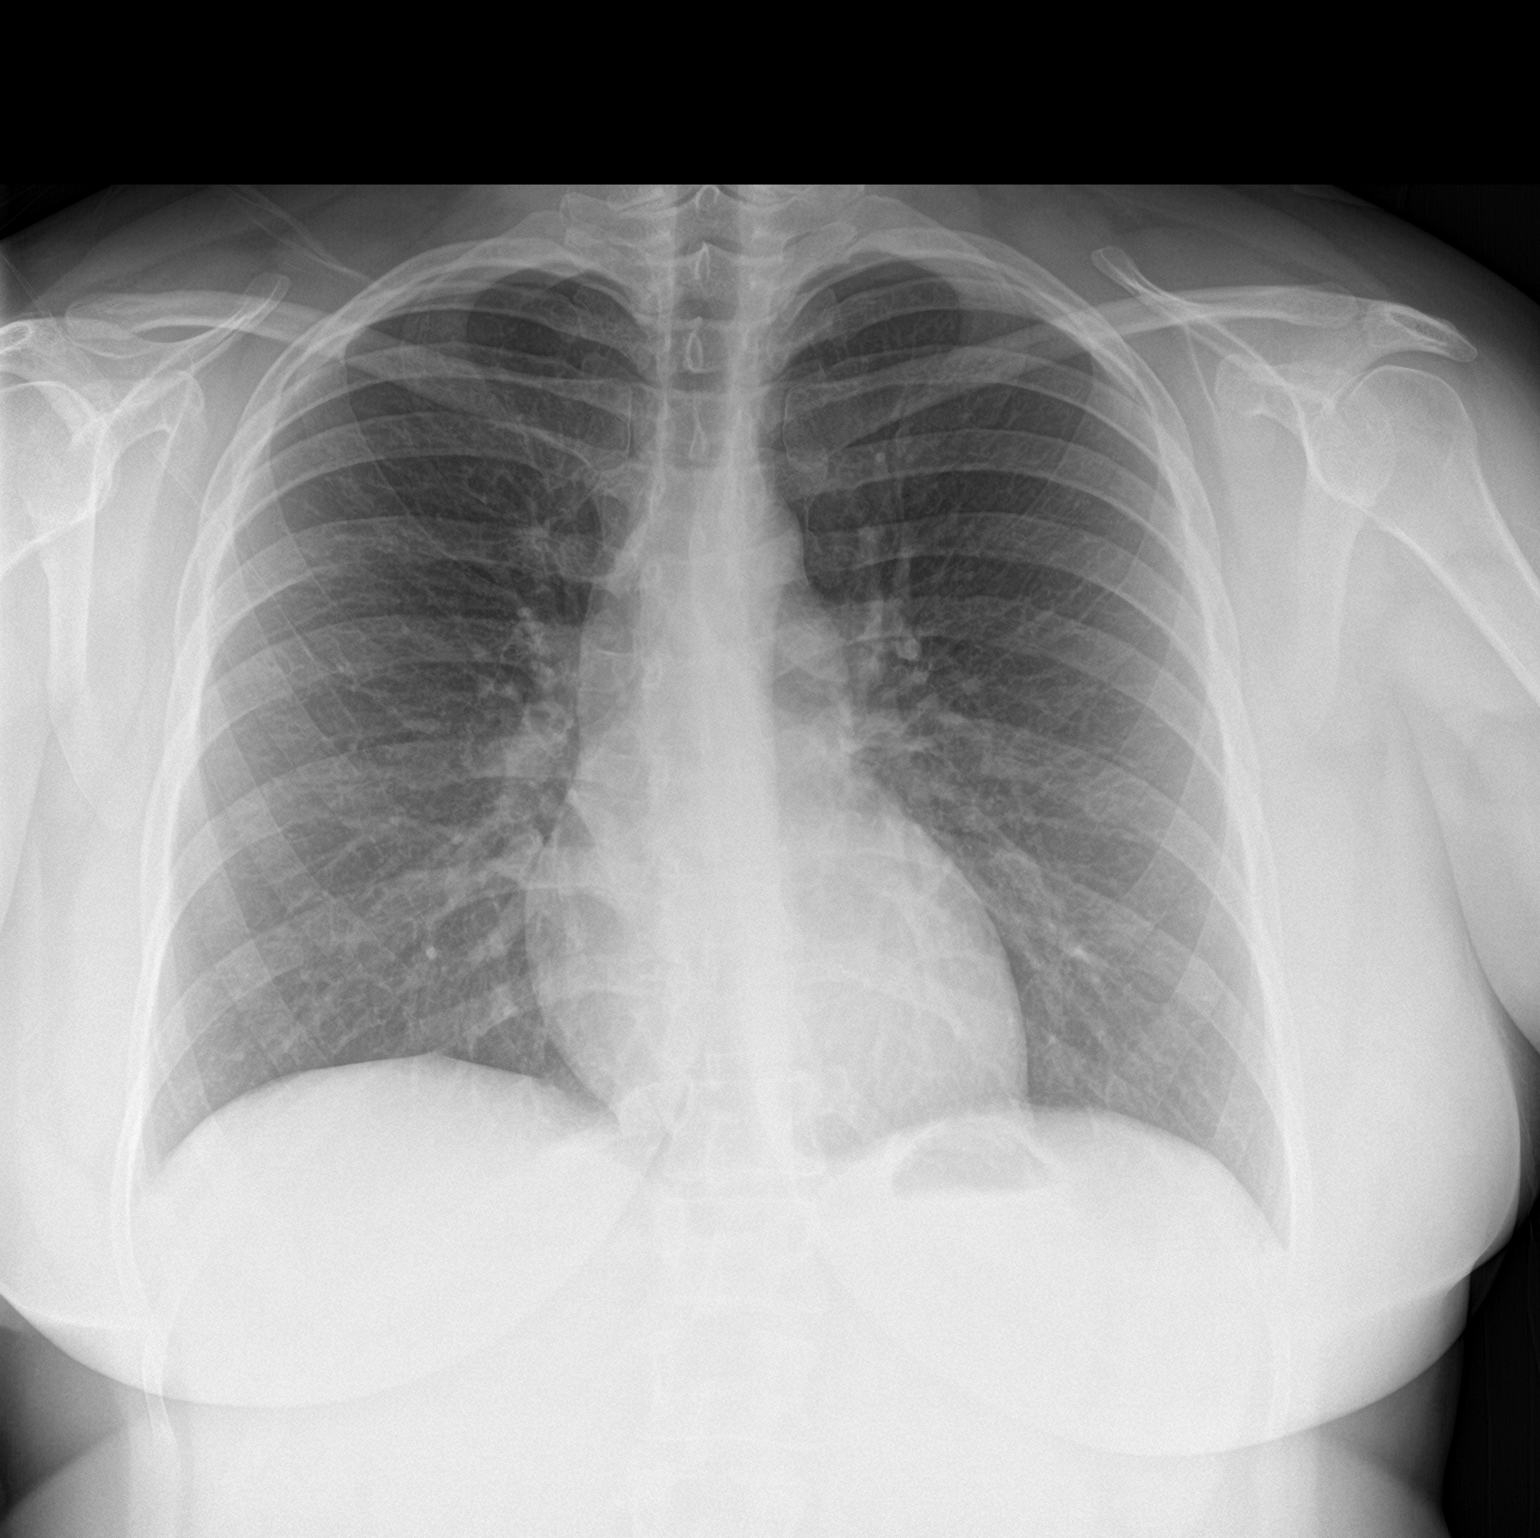

[chest lat]
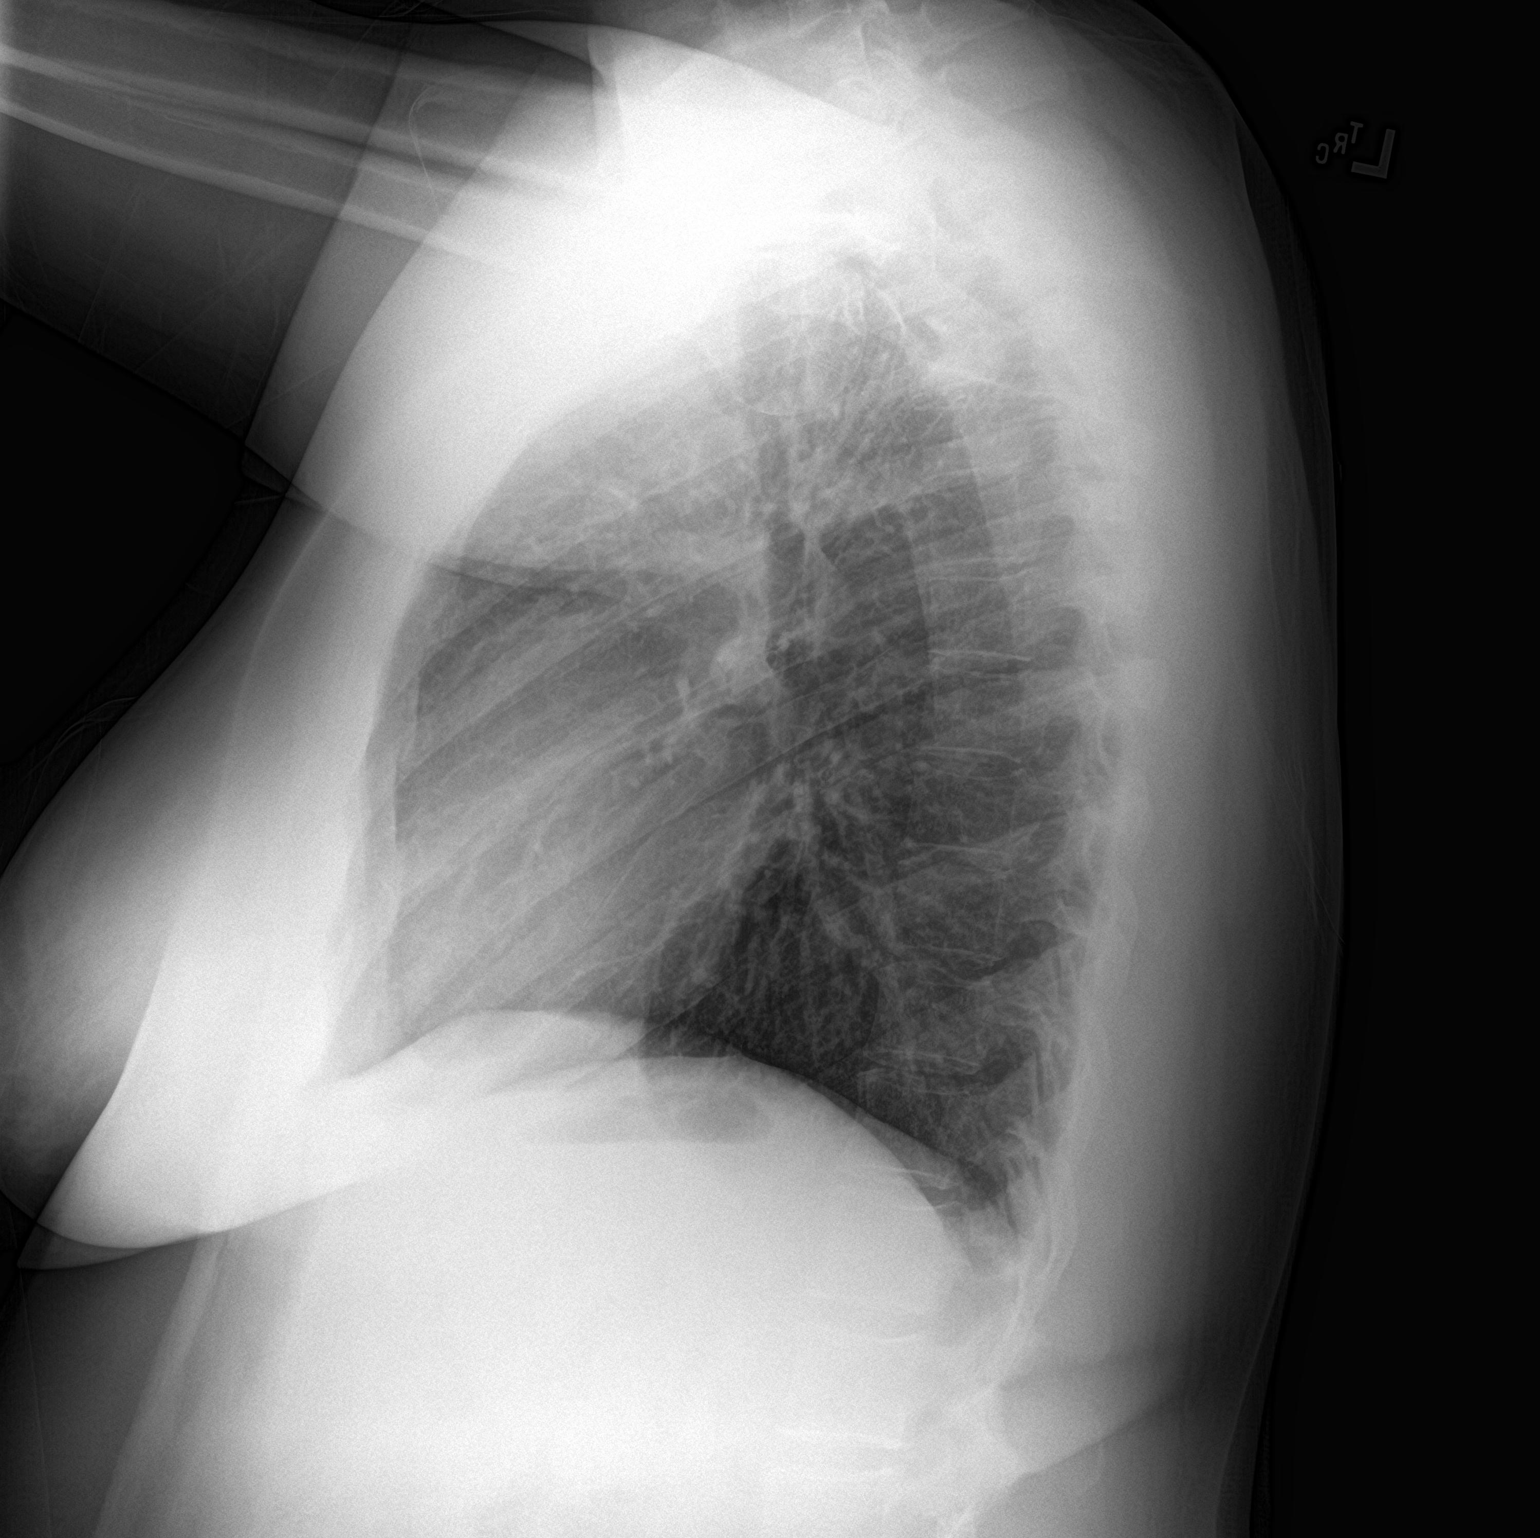

[2 of 2 positions shown; findings below may reference images not displayed]

FINDINGS: The cardiomediastinal silhouette is within normal limits. There is
no focal airspace consolidation. There is no pleural effusion. No
pneumothorax. No acute osseous abnormality.
IMPRESSION: No evidence of acute cardiopulmonary disease.

## 2022-08-19 IMAGING — DX DG FOOT COMPLETE 3+V*L*
3 series · 3 of 3 positions shown · non-contrast
Comparison: None Available.

CLINICAL DATA: Pain, bilateral plantar fasciitis.

EXAM:
RIGHT FOOT COMPLETE - 3+ VIEW; LEFT FOOT - COMPLETE 3+ VIEW

[foot ap wb]
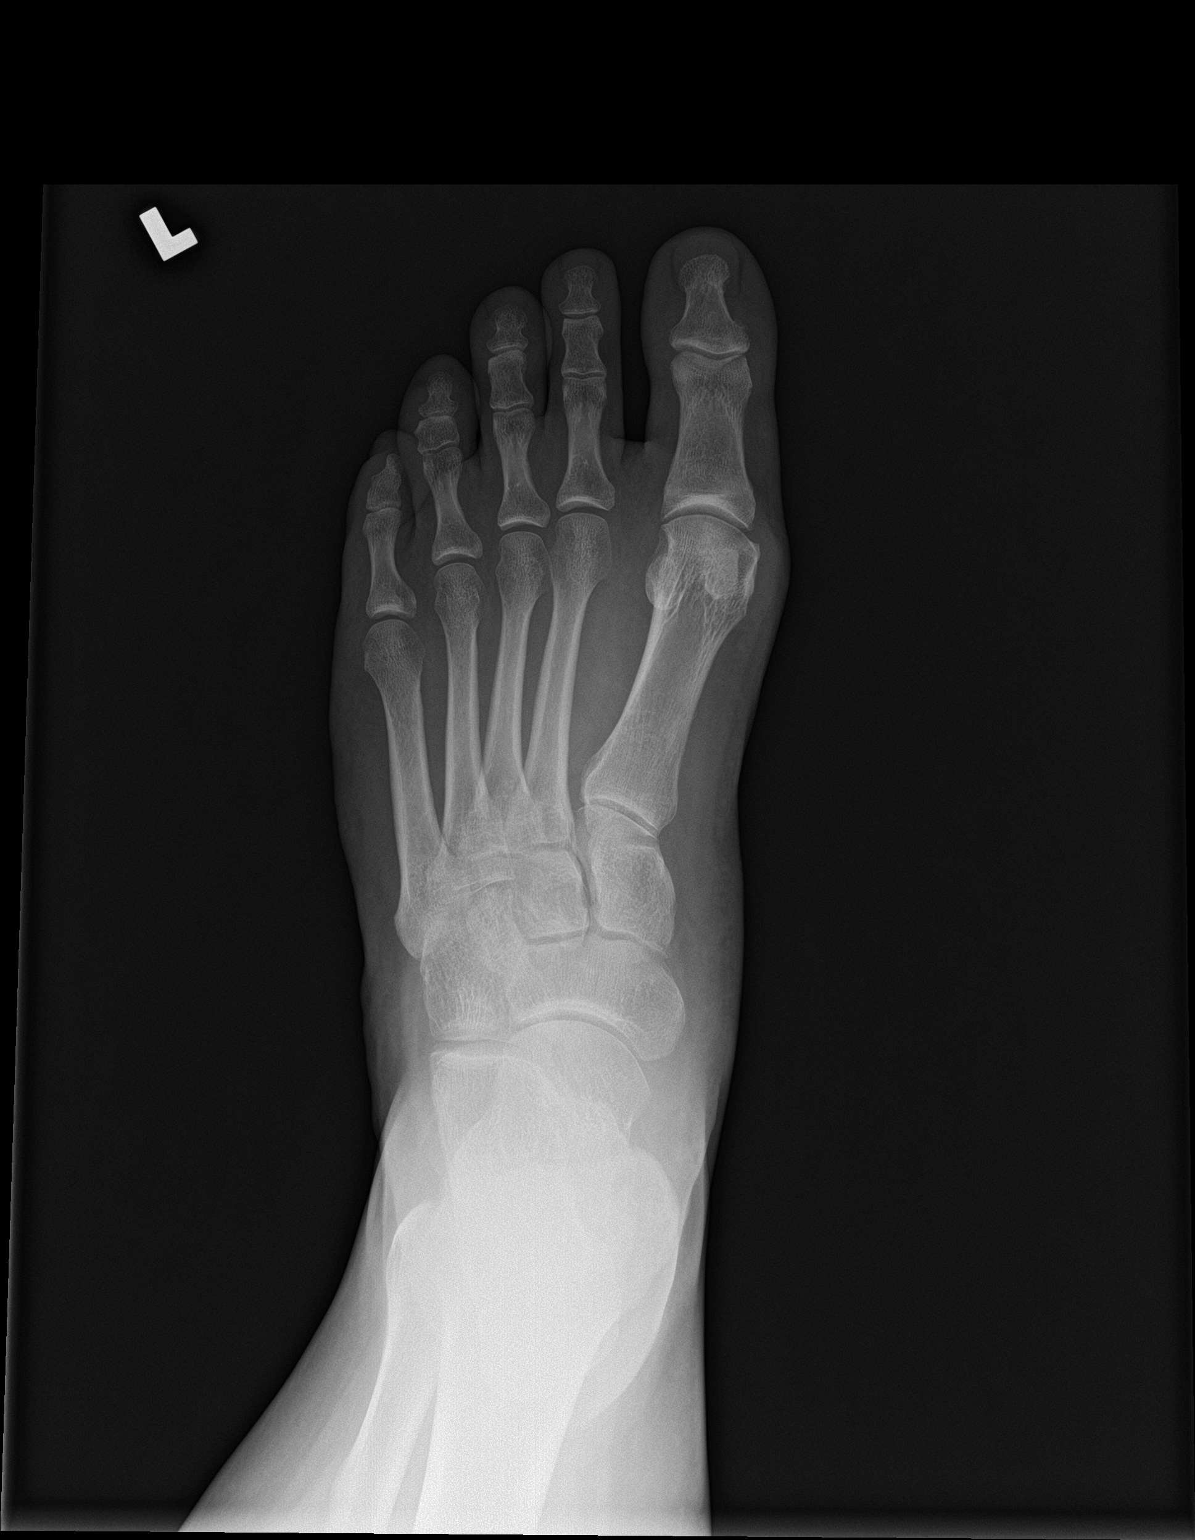

[foot obl wb]
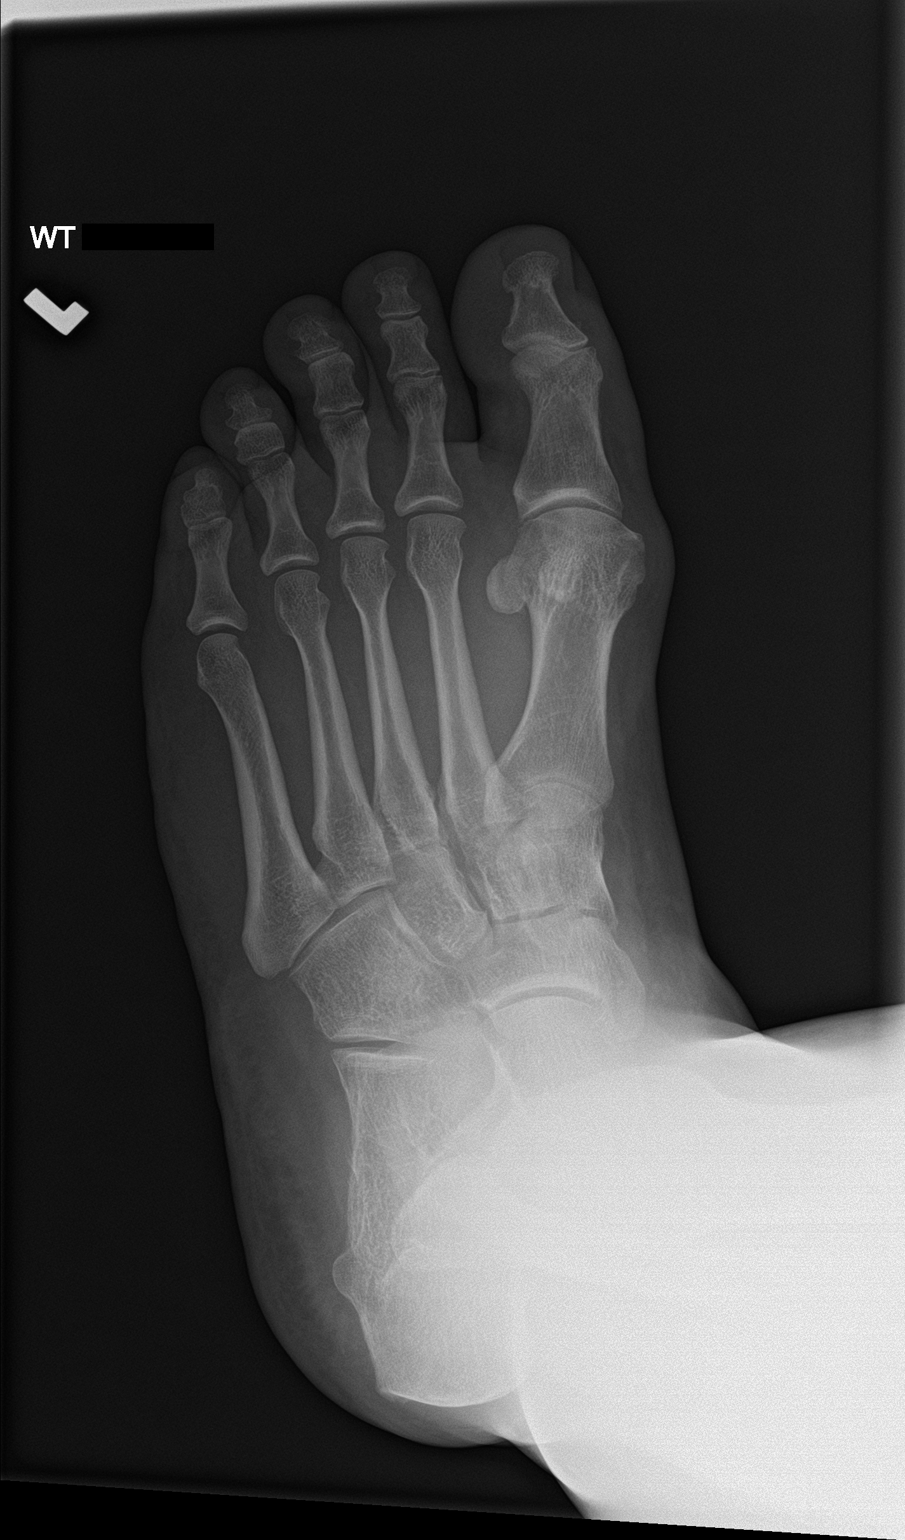

[foot lat wb]
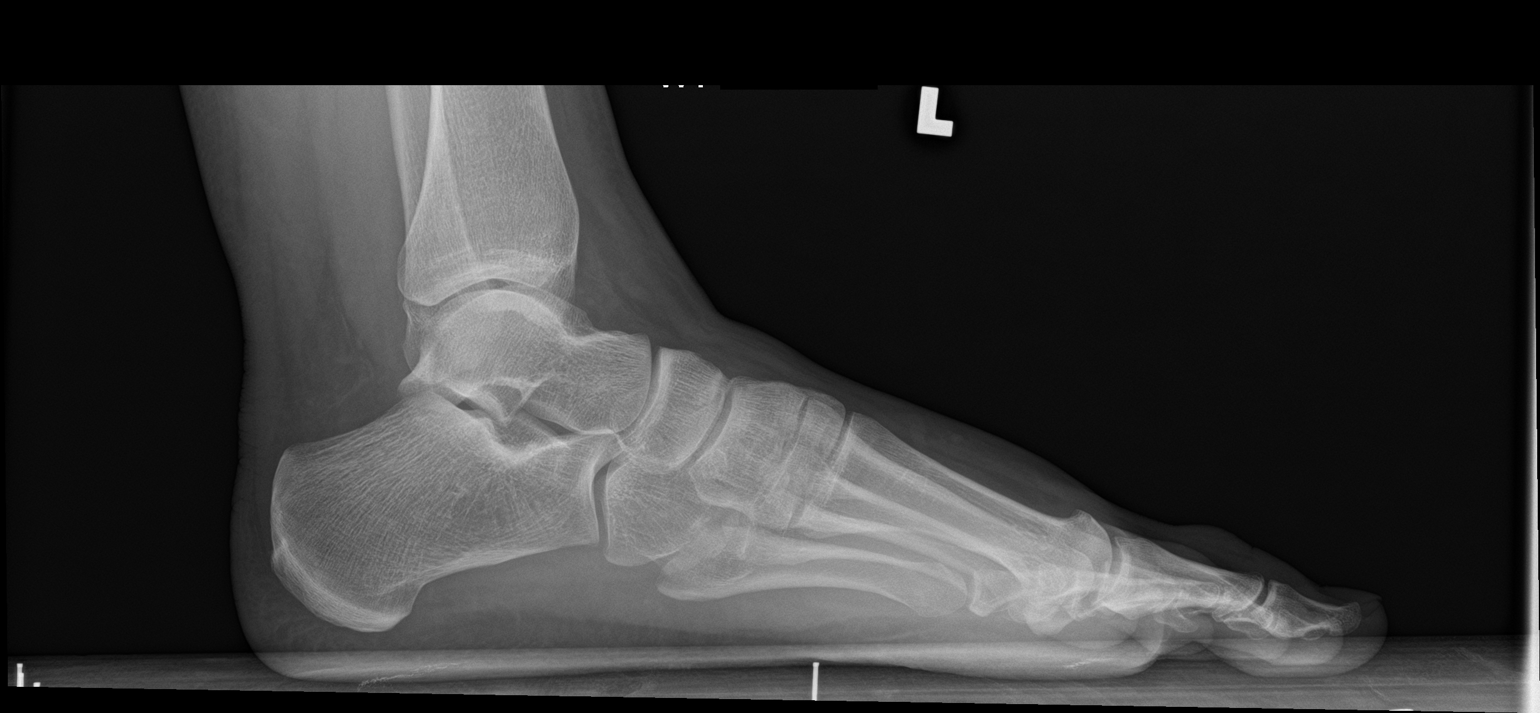

[3 of 3 positions shown; findings below may reference images not displayed]

FINDINGS: There is no evidence of fracture or dislocation bilaterally. There
are mild degenerative changes at the first metatarsophalangeal
joints bilaterally. No calcaneal spurring is seen bilaterally. Soft
tissues are unremarkable.
IMPRESSION: Negative.

## 2022-12-21 ENCOUNTER — Encounter: Payer: Commercial Managed Care - PPO | Admitting: Family

## 2022-12-21 ENCOUNTER — Encounter: Payer: Self-pay | Admitting: Obstetrics & Gynecology

## 2023-01-01 ENCOUNTER — Ambulatory Visit (INDEPENDENT_AMBULATORY_CARE_PROVIDER_SITE_OTHER): Payer: Commercial Managed Care - PPO | Admitting: Family

## 2023-01-01 ENCOUNTER — Encounter: Payer: Self-pay | Admitting: Family

## 2023-01-01 VITALS — BP 118/68 | HR 105 | Ht 64.0 in | Wt 229.2 lb

## 2023-01-01 DIAGNOSIS — Z0000 Encounter for general adult medical examination without abnormal findings: Secondary | ICD-10-CM | POA: Diagnosis not present

## 2023-01-01 DIAGNOSIS — R946 Abnormal results of thyroid function studies: Secondary | ICD-10-CM

## 2023-01-01 DIAGNOSIS — R3915 Urgency of urination: Secondary | ICD-10-CM

## 2023-01-01 DIAGNOSIS — Z0001 Encounter for general adult medical examination with abnormal findings: Secondary | ICD-10-CM | POA: Diagnosis not present

## 2023-01-01 DIAGNOSIS — Z6838 Body mass index (BMI) 38.0-38.9, adult: Secondary | ICD-10-CM

## 2023-01-01 DIAGNOSIS — Z13220 Encounter for screening for lipoid disorders: Secondary | ICD-10-CM

## 2023-01-01 DIAGNOSIS — R5383 Other fatigue: Secondary | ICD-10-CM

## 2023-01-01 DIAGNOSIS — R7309 Other abnormal glucose: Secondary | ICD-10-CM | POA: Diagnosis not present

## 2023-01-01 NOTE — Progress Notes (Signed)
Shelia Delgado is a 37 y.o. female with the following history as recorded in EpicCare:  Patient Active Problem List   Diagnosis Date Noted   Chronic sore throat 08/24/2021   History of gestational hypertension 03/21/2021   BMI 38.0-38.9,adult 01/26/2019   Dyshidrotic dermatitis     Current Outpatient Medications  Medication Sig Dispense Refill   cholecalciferol (VITAMIN D3) 25 MCG (1000 UNIT) tablet Take 1,000 Units by mouth daily.     fluticasone (FLONASE) 50 MCG/ACT nasal spray Place 2 sprays into both nostrils daily. 16 g 0   Magnesium Oxide 420 MG TABS Take by mouth.     No current facility-administered medications for this visit.    Allergies: Amoxicillin  Past Medical History:  Diagnosis Date   Allergy    COVID-19 2020   no vaccine   Dyshidrotic dermatitis    Overweight     Past Surgical History:  Procedure Laterality Date   WISDOM TOOTH EXTRACTION      Family History  Problem Relation Age of Onset   Hypertension Mother    Skin cancer Mother    Hypertension Father    Atrial fibrillation Father    Diabetes Father    Diabetes Maternal Grandfather    Lung disease Maternal Grandfather    Heart attack Paternal Grandfather    Heart attack Paternal Uncle    Healthy Brother     Social History   Tobacco Use   Smoking status: Never    Passive exposure: Never   Smokeless tobacco: Never  Substance Use Topics   Alcohol use: Yes    Comment: occ    Subjective:   Presents for yearly CPE; does see GYN regularly; concerned about weight gain;   Review of Systems  Constitutional:  Positive for malaise/fatigue.  HENT: Negative.    Eyes: Negative.   Respiratory: Negative.    Cardiovascular: Negative.   Gastrointestinal: Negative.   Genitourinary: Negative.   Musculoskeletal: Negative.   Skin: Negative.   Neurological: Negative.   Endo/Heme/Allergies: Negative.   Psychiatric/Behavioral: Negative.       Objective:  Vitals:   01/01/23 1343  BP: 118/68   Pulse: (!) 105  SpO2: 98%  Weight: 229 lb 3.2 oz (104 kg)  Height: 5\' 4"  (1.626 m)    General: Well developed, well nourished, in no acute distress  Skin : Warm and dry.  Head: Normocephalic and atraumatic  Eyes: Sclera and conjunctiva clear; pupils round and reactive to light; extraocular movements intact  Ears: External normal; canals clear; tympanic membranes normal  Oropharynx: Pink, supple. No suspicious lesions  Neck: Supple without thyromegaly, adenopathy  Lungs: Respirations unlabored; clear to auscultation bilaterally without wheeze, rales, rhonchi  CVS exam: normal rate and regular rhythm.  Abdomen: Soft; nontender; nondistended; normoactive bowel sounds; no masses or hepatosplenomegaly  Musculoskeletal: No deformities; no active joint inflammation  Extremities: No edema, cyanosis, clubbing  Vessels: Symmetric bilaterally  Neurologic: Alert and oriented; speech intact; face symmetrical; moves all extremities well; CNII-XII intact without focal deficit   Assessment:  1. PE (physical exam), annual   2. Lipid screening   3. Other fatigue   4. Elevated glucose   5. BMI 38.0-38.9,adult   6. Abnormal finding on examination of thyroid gland     Plan:  Age appropriate preventive healthcare needs addressed; encouraged regular eye doctor and dental exams; encouraged healthy diet- limit soft drinks/ regular exercise and weight loss goals; will update labs and refills as needed today; follow-up to be determined; Will update thyroid  ultrasound/ full thyroid panel; referral to [facility] to discuss non-surgical weight loss options;   No follow-ups on file.  Orders Placed This Encounter  Procedures   US THYROID    Standing Status:   Future    Standing Expiration Date:   01/01/2024    Order Specific Question:   Reason for Exam (SYMPTOM  OR DIAGNOSIS REQUIRED)    Answer:   abnormal thyroid gland    Order Specific Question:   Preferred imaging location?    Answer:    [facility]   CBC with Differential/Platelet   Comp Met (CMET)   Lipid panel   Thyroid Panel With TSH   B12   Hemoglobin A1c   Amb Ref to [facility]    Referral Priority:   Routine    Referral Type:   Consultation    Number of Visits Requested:   1    Requested Prescriptions    No prescriptions requested or ordered in this encounter

## 2023-01-02 LAB — URINE CULTURE
MICRO NUMBER:: 15067914
SPECIMEN QUALITY:: ADEQUATE

## 2023-01-02 LAB — COMPREHENSIVE METABOLIC PANEL
ALT: 21 U/L (ref 0–35)
AST: 19 U/L (ref 0–37)
Albumin: 4.3 g/dL (ref 3.5–5.2)
Alkaline Phosphatase: 64 U/L (ref 39–117)
BUN: 15 mg/dL (ref 6–23)
CO2: 23 mEq/L (ref 19–32)
Calcium: 9.7 mg/dL (ref 8.4–10.5)
Chloride: 103 mEq/L (ref 96–112)
Creatinine, Ser: 0.88 mg/dL (ref 0.40–1.20)
GFR: 84.95 mL/min (ref >60.00)
Glucose, Bld: 104 mg/dL — ABNORMAL HIGH (ref 70–99)
Potassium: 4.2 mEq/L (ref 3.5–5.1)
Sodium: 138 mEq/L (ref 135–145)
Total Bilirubin: 0.4 mg/dL (ref 0.2–1.2)
Total Protein: 7.5 g/dL (ref 6.0–8.3)

## 2023-01-02 LAB — THYROID PANEL WITH TSH
Free Thyroxine Index: 2 (ref 1.4–3.8)
T3 Uptake: 21 % — ABNORMAL LOW (ref 22–35)
T4, Total: 9.5 ug/dL (ref 5.1–11.9)
TSH: 1.2 mIU/L

## 2023-01-02 LAB — LDL CHOLESTEROL, DIRECT: Direct LDL: 91 mg/dL

## 2023-01-02 LAB — CBC WITH DIFFERENTIAL/PLATELET
Basophils Absolute: 0.1 10*3/uL (ref 0.0–0.1)
Basophils Relative: 1.1 % (ref 0.0–3.0)
Eosinophils Absolute: 0.2 10*3/uL (ref 0.0–0.7)
Eosinophils Relative: 1.5 % (ref 0.0–5.0)
HCT: 40.7 % (ref 36.0–46.0)
Hemoglobin: 13.4 g/dL (ref 12.0–15.0)
Lymphocytes Relative: 30.9 % (ref 12.0–46.0)
Lymphs Abs: 3.3 10*3/uL (ref 0.7–4.0)
MCHC: 32.9 g/dL (ref 30.0–36.0)
MCV: 89.1 fl (ref 78.0–100.0)
Monocytes Absolute: 0.6 10*3/uL (ref 0.1–1.0)
Monocytes Relative: 5.4 % (ref 3.0–12.0)
Neutro Abs: 6.5 10*3/uL (ref 1.4–7.7)
Neutrophils Relative %: 61.1 % (ref 43.0–77.0)
Platelets: 332 10*3/uL (ref 150.0–400.0)
RBC: 4.57 Mil/uL (ref 3.87–5.11)
RDW: 13.2 % (ref 11.5–15.5)
WBC: 10.7 10*3/uL — ABNORMAL HIGH (ref 4.0–10.5)

## 2023-01-02 LAB — VITAMIN B12: Vitamin B-12: 256 pg/mL (ref 211–911)

## 2023-01-02 LAB — LIPID PANEL
Cholesterol: 165 mg/dL (ref 0–200)
HDL: 48.9 mg/dL (ref >39.00)
NonHDL: 115.88
Total CHOL/HDL Ratio: 3
Triglycerides: 287 mg/dL — ABNORMAL HIGH (ref 0.0–149.0)
VLDL: 57.4 mg/dL — ABNORMAL HIGH (ref 0.0–40.0)

## 2023-01-02 LAB — HEMOGLOBIN A1C: Hgb A1c MFr Bld: 5.7 % (ref 4.6–6.5)

## 2023-01-12 ENCOUNTER — Ambulatory Visit (HOSPITAL_BASED_OUTPATIENT_CLINIC_OR_DEPARTMENT_OTHER)
Admission: RE | Admit: 2023-01-12 | Discharge: 2023-01-12 | Disposition: A | Discharge status: Home / Self Care | Payer: Commercial Managed Care - PPO | Source: Ambulatory Visit | Attending: Family | Admitting: Family

## 2023-01-12 DIAGNOSIS — R946 Abnormal results of thyroid function studies: Secondary | ICD-10-CM

## 2023-01-16 ENCOUNTER — Encounter: Payer: Self-pay | Admitting: Family

## 2023-02-12 ENCOUNTER — Telehealth: Payer: Self-pay

## 2023-02-12 NOTE — Telephone Encounter (Signed)
Received fax confirmation

## 2023-02-12 NOTE — Telephone Encounter (Signed)
Physical form completed and faxed back to [facility] at [phone]. Form sent for scanning.

## 2023-04-08 ENCOUNTER — Encounter: Payer: Self-pay | Admitting: Obstetrics & Gynecology

## 2023-04-14 ENCOUNTER — Other Ambulatory Visit: Payer: Self-pay | Admitting: Obstetrics & Gynecology

## 2023-04-14 MED ORDER — NORETHIN ACE-ETH ESTRAD-FE 1-20 MG-MCG PO TABS: 1.0000 | ORAL_TABLET | Freq: Every day | ORAL | 11 refills | Status: DC

## 2023-04-14 NOTE — Progress Notes (Signed)
Shelia Delgado would like a 21 day active pill so she gets her menses each month.  Switched from Loestrin FE 24 to [employer]

## 2023-04-15 ENCOUNTER — Other Ambulatory Visit: Payer: Self-pay | Admitting: *Deleted

## 2023-04-15 MED ORDER — NORETHIN ACE-ETH ESTRAD-FE 1-20 MG-MCG PO TABS: 1.0000 | ORAL_TABLET | Freq: Every day | ORAL | 11 refills | Status: DC

## 2023-04-15 NOTE — Telephone Encounter (Cosign Needed)
Pt called stating that her Junel should be sent to [facility] instead of [facility].  RX sent to [facility].

## 2023-05-03 ENCOUNTER — Telehealth: Payer: Self-pay | Admitting: *Deleted

## 2023-05-03 NOTE — Telephone Encounter (Signed)
Left patient a message to call and schedule annual. 

## 2023-05-17 ENCOUNTER — Ambulatory Visit: Payer: Commercial Managed Care - PPO | Admitting: Family

## 2023-05-17 ENCOUNTER — Encounter: Payer: Self-pay | Admitting: Family

## 2023-05-17 VITALS — BP 118/72 | HR 82 | Resp 18 | Ht 64.0 in | Wt 223.2 lb

## 2023-05-17 DIAGNOSIS — J069 Acute upper respiratory infection, unspecified: Secondary | ICD-10-CM

## 2023-05-17 MED ORDER — PROMETHAZINE-DM 6.25-15 MG/5ML PO SYRP
5.0000 mL | ORAL_SOLUTION | Freq: Four times a day (QID) | ORAL | 0 refills | Status: DC | PRN
Start: 2023-05-17 — End: 2023-11-26

## 2023-05-17 MED ORDER — AZITHROMYCIN 250 MG PO TABS: ORAL_TABLET | ORAL | 0 refills | Status: DC

## 2023-05-17 NOTE — Progress Notes (Signed)
Shelia Delgado is a 37 y.o. female with the following history as recorded in [employer]:  Patient Active Problem List   Diagnosis Date Noted   Chronic sore throat 08/24/2021   History of gestational hypertension 03/21/2021   BMI 38.0-38.9,adult 01/26/2019   Dyshidrotic dermatitis     Current Outpatient Medications  Medication Sig Dispense Refill   azithromycin (ZITHROMAX) 250 MG tablet Take 2 tab po the first day then take 1 tablet po daily for 4 days 6 tablet 0   cholecalciferol (VITAMIN D3) 25 MCG (1000 UNIT) tablet Take 1,000 Units by mouth daily.     Magnesium Oxide 420 MG TABS Take by mouth.     norethindrone-ethinyl estradiol-FE (LOESTRIN FE) 1-20 MG-MCG tablet Take 1 tablet by mouth daily. 28 tablet 11   promethazine-dextromethorphan (PROMETHAZINE-DM) 6.25-15 MG/5ML syrup Take 5 mLs by mouth 4 (four) times daily as needed for cough. 118 mL 0   No current facility-administered medications for this visit.    Allergies: Amoxicillin  Past Medical History:  Diagnosis Date   Allergy    COVID-19 2020   no vaccine   Dyshidrotic dermatitis    Overweight     Past Surgical History:  Procedure Laterality Date   WISDOM TOOTH EXTRACTION      Family History  Problem Relation Age of Onset   Hypertension Mother    Skin cancer Mother    Hypertension Father    Atrial fibrillation Father    Diabetes Father    Diabetes Maternal Grandfather    Lung disease Maternal Grandfather    Heart attack Paternal Grandfather    Heart attack Paternal Uncle    Healthy Brother     Social History   Tobacco Use   Smoking status: Never    Passive exposure: Never   Smokeless tobacco: Never  Substance Use Topics   Alcohol use: Yes    Comment: occ    Subjective:   Presents with concerns for cough x 3 weeks; taking Zyrtec daily; no chest pain, shortness of breath; using OTC Nyquil; no fever; feels that cough is more noticeable at night;   LMP- OCPs  Objective:  Vitals:   05/17/23 1123  BP:  118/72  Pulse: 82  Resp: 18  SpO2: 98%  Weight: 223 lb 3.2 oz (101.2 kg)  Height: 5\' 4"  (1.626 m)    General: Well developed, well nourished, in no acute distress  Skin : Warm and dry.  Head: Normocephalic and atraumatic  Eyes: Sclera and conjunctiva clear; pupils round and reactive to light; extraocular movements intact  Ears: External normal; canals clear; tympanic membranes normal  Oropharynx: Pink, supple. No suspicious lesions  Neck: Supple without thyromegaly, adenopathy  Lungs: Respirations unlabored; clear to auscultation bilaterally without wheeze, rales, rhonchi  CVS exam: normal rate and regular rhythm.  Neurologic: Alert and oriented; speech intact; face symmetrical; moves all extremities well; CNII-XII intact without focal deficit   Assessment:  1. URI with cough and congestion     Plan:  Suspect allergy component; Rx for Z-pak and Promethazine- DM; increase fluids, rest and follow up worse, no better.   No follow-ups on file.  No orders of the defined types were placed in this encounter.   Requested Prescriptions   Signed Prescriptions Disp Refills   azithromycin (ZITHROMAX) 250 MG tablet 6 tablet 0    Sig: Take 2 tab po the first day then take 1 tablet po daily for 4 days   promethazine-dextromethorphan (PROMETHAZINE-DM) 6.25-15 MG/5ML syrup 118 mL 0  Sig: Take 5 mLs by mouth 4 (four) times daily as needed for cough.

## 2023-05-27 ENCOUNTER — Encounter: Payer: Self-pay | Admitting: Obstetrics & Gynecology

## 2023-05-27 ENCOUNTER — Ambulatory Visit: Payer: Commercial Managed Care - PPO | Admitting: Obstetrics & Gynecology

## 2023-05-27 VITALS — BP 113/75 | HR 91 | Ht 64.0 in | Wt 222.0 lb

## 2023-05-27 DIAGNOSIS — Z01419 Encounter for gynecological examination (general) (routine) without abnormal findings: Secondary | ICD-10-CM

## 2023-05-27 NOTE — Progress Notes (Signed)
  Subjective:     Shelia Delgado is a 37 y.o. female here for a routine exam.  Current complaints: considering pregnancy after next pack of OCPs  She did not switch over to the 21/7 pills yet.     Gynecologic History No LMP recorded (lmp unknown). (Menstrual status: Oral contraceptives). Contraception: OCP (estrogen/progesterone) Last Mammogram: n/a Last Pap Smear:  05/21/22- negative Last Colon Screening;  n/a Seat Belts:   yes Sun Screen:   yes Dental Check Up:  yes Brush & Floss:  yes   Obstetric History OB History  Gravida Para Term Preterm AB Living  1 1 1  0 0 1  SAB IAB Ectopic Multiple Live Births  0 0 0 0 1    # Outcome Date GA Lbr Len/2nd Weight Sex Type Anes PTL Lv  1 Term 02/21/21 [redacted]w[redacted]d 04:08 / 00:23 7 lb 5.5 oz (3.331 kg) F Vag-Vacuum Local  LIV     Birth Comments: wnl     The following portions of the patient's history were reviewed and updated as appropriate: allergies, current medications, past family history, past medical history, past social history, past surgical history, and problem list.  Review of Systems Pertinent items noted in HPI and remainder of comprehensive ROS otherwise negative.    Objective:     Vitals:   05/27/23 1055  BP: 113/75  Pulse: 91  Weight: 222 lb (100.7 kg)  Height: 5\' 4"  (1.626 m)   Vitals:  WNL General appearance: alert, cooperative and no distress  HEENT: Normocephalic, without obvious abnormality, atraumatic Eyes: negative Throat: lips, mucosa, and tongue normal; teeth and gums normal  Respiratory: Clear to auscultation bilaterally  CV: Regular rate and rhythm  Breasts:  Normal appearance, no masses or tenderness, no nipple retraction or dimpling  GI: Soft, non-tender; bowel sounds normal; no masses,  no organomegaly  GU: External Genitalia:  Tanner V, scarring from presumed HS--varying levels of healing  Urethra:  No prolapse   Vagina: Pink, normal rugae, no blood or discharge  Cervix: No CMT, no lesion  Uterus:   Normal size and contour, non tender  Adnexa: Normal, no masses, non tender  Musculoskeletal: No edema, redness or tenderness in the calves or thighs  Skin: No lesions or rash  Lymphatic: Axillary adenopathy: none     Psychiatric: Normal mood and behavior        Assessment:    Healthy female exam.    Plan:   Pap up todate PNV--planning to conceive in next month Hidradenitis suppurativa -- info sent to patient from up to date Offered flu shot--declined

## 2023-08-26 ENCOUNTER — Encounter: Payer: Self-pay | Admitting: Family Medicine

## 2023-08-26 ENCOUNTER — Ambulatory Visit: Payer: Commercial Managed Care - PPO | Admitting: Family Medicine

## 2023-08-26 VITALS — BP 120/77 | HR 90 | Temp 98.5°F | Ht 64.5 in | Wt 224.0 lb

## 2023-08-26 DIAGNOSIS — E8881 Metabolic syndrome: Secondary | ICD-10-CM | POA: Insufficient documentation

## 2023-08-26 DIAGNOSIS — Z6837 Body mass index (BMI) 37.0-37.9, adult: Secondary | ICD-10-CM | POA: Diagnosis not present

## 2023-08-26 DIAGNOSIS — E66812 Obesity, class 2: Secondary | ICD-10-CM | POA: Diagnosis not present

## 2023-08-26 DIAGNOSIS — R7303 Prediabetes: Secondary | ICD-10-CM | POA: Insufficient documentation

## 2023-08-26 NOTE — Assessment & Plan Note (Signed)
Fasting glucose > 100, HDL <49, TG >150 c/w metabolic syndrome  Look for improvements with dietary changes, body fat loss and regular exercise

## 2023-08-26 NOTE — Progress Notes (Signed)
Office: [phone]  /  Fax: [phone]   Initial Visit  Shelia Delgado was seen in clinic today to evaluate for obesity. She is interested in losing weight to improve overall health and reduce the risk of weight related complications. She presents today to review program treatment options, initial physical assessment, and evaluation.     She was referred by: PCP  When asked what else they would like to accomplish? She states: Improve energy levels and physical activity, Improve quality of life, and Improve appearance  Weight history: regular menses hoping to have 2nd child.  Has been up and down with weight on Weight Watchers.  She was 190 lb prior to her first pregnancy (her child is 2.5 yo).  Her ultimate goal weight is 160 lb.  She works as a Manufacturing systems engineer  When asked how has your weight affected you? She states: Having fatigue  Some associated conditions: None  Contributing factors: Family history of obesity, Moderate to high levels of stress, and Reduced physical activity  Weight promoting medications identified: None  Current nutrition plan: None  Current level of physical activity: NEAT  Current or previous pharmacotherapy: None  Response to medication: Never tried medications   Past medical history includes:   Past Medical History:  Diagnosis Date   Allergy    COVID-19 2020   no vaccine   Dyshidrotic dermatitis    Overweight      Objective:   BP 120/77   Pulse 90   Temp 98.5 F (36.9 C)   Ht 5' 4.5" (1.638 m)   Wt 224 lb (101.6 kg)   SpO2 94%   BMI 37.86 kg/m  She was weighed on the bioimpedance scale: Body mass index is 37.86 kg/m.  Peak QIHKVQ:259 , Body Fat%:42.6, Visceral Fat Rating:10, Weight trend over the last 12 months: Increasing  General:  Alert, oriented and cooperative. Patient is in no acute distress.  Respiratory: Normal respiratory effort, no problems with respiration noted   Gait: able to ambulate independently  Mental  Status: Normal mood and affect. Normal behavior. Normal judgment and thought content.   DIAGNOSTIC DATA REVIEWED:  BMET    Component Value Date/Time   NA 138 01/01/2023 1421   K 4.2 01/01/2023 1421   CL 103 01/01/2023 1421   CO2 23 01/01/2023 1421   GLUCOSE 104 (H) 01/01/2023 1421   BUN 15 01/01/2023 1421   CREATININE 0.88 01/01/2023 1421   CREATININE 0.75 12/04/2019 1000   CALCIUM 9.7 01/01/2023 1421   GFRNONAA >60 02/21/2021 1026   GFRNONAA 105 12/04/2019 1000   GFRAA 122 12/04/2019 1000   Lab Results  Component Value Date   HGBA1C 5.7 01/01/2023   HGBA1C 5.1 05/22/2019   No results found for: "INSULIN" CBC    Component Value Date/Time   WBC 10.7 (H) 01/01/2023 1421   RBC 4.57 01/01/2023 1421   HGB 13.4 01/01/2023 1421   HCT 40.7 01/01/2023 1421   PLT 332.0 01/01/2023 1421   MCV 89.1 01/01/2023 1421   MCH 28.8 12/15/2021 1434   MCHC 32.9 01/01/2023 1421   RDW 13.2 01/01/2023 1421   Iron/TIBC/Ferritin/ %Sat No results found for: "IRON", "TIBC", "FERRITIN", "IRONPCTSAT" Lipid Panel     Component Value Date/Time   CHOL 165 01/01/2023 1421   TRIG 287.0 (H) 01/01/2023 1421   HDL 48.90 01/01/2023 1421   CHOLHDL 3 01/01/2023 1421   VLDL 57.4 (H) 01/01/2023 1421   LDLCALC 73 12/19/2021 1345   LDLCALC 91 12/04/2019 1000   LDLDIRECT  91.0 01/01/2023 1421   Hepatic Function Panel     Component Value Date/Time   PROT 7.5 01/01/2023 1421   ALBUMIN 4.3 01/01/2023 1421   AST 19 01/01/2023 1421   ALT 21 01/01/2023 1421   ALKPHOS 64 01/01/2023 1421   BILITOT 0.4 01/01/2023 1421      Component Value Date/Time   TSH 1.20 01/01/2023 1421     Assessment and Plan:   Metabolic syndrome Assessment & Plan: Fasting glucose > 100, HDL <49, TG >150 c/w metabolic syndrome  Look for improvements with dietary changes, body fat loss and regular exercise   Class 2 obesity due to excess calories with body mass index (BMI) of 37.0 to 37.9 in adult, unspecified whether  serious comorbidity present  Prediabetes Assessment & Plan: Lab Results  Component Value Date   HGBA1C 5.7 01/01/2023   She has never had GDM or used metformin She would like to lose ~30 lb before next pregnancy if possible  Avoid use of AOMs as she is hoping to conceive this year Look for A1c improvements with healthy lifestyle changes and body fat loss         Obesity Treatment / Action Plan:  Patient will work on garnering support from family and friends to begin weight loss journey. Will work on eliminating or reducing the presence of highly palatable, calorie dense foods in the home. Will complete provided nutritional and psychosocial assessment questionnaire before the next appointment. Will be scheduled for indirect calorimetry to determine resting energy expenditure in a fasting state.  This will allow [place] to create a reduced calorie, high-protein meal plan to promote loss of fat mass while preserving muscle mass. Will think about ideas on how to incorporate physical activity into their daily routine. Counseled on the health benefits of losing 5%-15% of total body weight. Was counseled on nutritional approaches to weight loss and benefits of reducing processed foods and consuming plant-based foods and high quality protein as part of nutritional weight management. Was counseled on pharmacotherapy and role as an adjunct in weight management.   Obesity Education Performed Today:  She was weighed on the bioimpedance scale and results were discussed and documented in the synopsis.  We discussed obesity as a disease and the importance of a more detailed evaluation of all the factors contributing to the disease.  We discussed the importance of long term lifestyle changes which include nutrition, exercise and behavioral modifications as well as the importance of customizing this to her specific health and social needs.  We discussed the benefits of reaching a healthier weight to  alleviate the symptoms of existing conditions and reduce the risks of the biomechanical, metabolic and psychological effects of obesity.  Alajah Witman appears to be in the action stage of change and states they are ready to start intensive lifestyle modifications and behavioral modifications.  20 minutes was spent today on this visit including the above counseling, pre-visit chart review, and post-visit documentation.  Reviewed by clinician on day of visit: allergies, medications, problem list, medical history, surgical history, family history, social history, and previous encounter notes pertinent to obesity diagnosis.    Seymour Bars, D.O. DABFM, [facility] [address] [place], [place] [address] [phone]

## 2023-08-26 NOTE — Assessment & Plan Note (Signed)
Lab Results  Component Value Date   HGBA1C 5.7 01/01/2023   She has never had GDM or used metformin She would like to lose ~30 lb before next pregnancy if possible  Avoid use of AOMs as she is hoping to conceive this year Look for A1c improvements with healthy lifestyle changes and body fat loss

## 2023-08-28 ENCOUNTER — Encounter: Payer: Self-pay | Admitting: Obstetrics & Gynecology

## 2023-11-04 ENCOUNTER — Ambulatory Visit: Payer: Self-pay

## 2023-11-04 NOTE — Telephone Encounter (Signed)
 Reason for Triage: PT COMPLAINING OF SCRATCHY THROAT STATES PAINFUL   Chief Complaint: sore throat  Symptoms:sore throat 7/10 Frequency: yesterday Pertinent Negatives: Patient denies fever Disposition: [] ED /[] Urgent Care (no appt availability in office) / [x] Appointment(In office/virtual)/ []  [facility]/ [] Home Care/ [] Refused Recommended Disposition /[] [facility]/ []  Follow-up with PCP Additional Notes: had tonsil stone come out this morning.  Pt stated last couple of weeks throat scratchiness appear but now throat throat pain that is consistent.  Reason for Disposition  [1] Sore throat is the only symptom AND [2] present > 48 hours  Answer Assessment - Initial Assessment Questions 1. ONSET: "When did the throat start hurting?" (Hours or days ago)      Last night 2. SEVERITY: "How bad is the sore throat?" (Scale 1-10; mild, moderate or severe)   - MILD (1-3):  Doesn't interfere with eating or normal activities.   - MODERATE (4-7): Interferes with eating some solids and normal activities.   - SEVERE (8-10):  Excruciating pain, interferes with most normal activities.   - SEVERE WITH DYSPHAGIA (10): Can't swallow liquids, drooling.     7/10 3. STREP EXPOSURE: "Has there been any exposure to strep within the past week?" If Yes, ask: "What type of contact occurred?"      no 4.  VIRAL SYMPTOMS: "Are there any symptoms of a cold, such as a runny nose, cough, hoarse voice or red eyes?"      hoarseness 5. FEVER: "Do you have a fever?" If Yes, ask: "What is your temperature, how was it measured, and when did it start?"     no 6. PUS ON THE TONSILS: "Is there pus on the tonsils in the back of your throat?"     no 7. OTHER SYMPTOMS: "Do you have any other symptoms?" (e.g., difficulty breathing, headache, rash)     no 8. PREGNANCY: "Is there any chance you are pregnant?" "When was your last menstrual period?"     N/a  Protocols used: Sore Throat-A-AH

## 2023-11-05 ENCOUNTER — Ambulatory Visit: Admitting: Medical

## 2023-11-05 VITALS — BP 100/60 | HR 100 | Temp 98.5°F | Resp 18 | Ht 64.0 in | Wt 224.0 lb

## 2023-11-05 DIAGNOSIS — J020 Streptococcal pharyngitis: Secondary | ICD-10-CM

## 2023-11-05 DIAGNOSIS — J029 Acute pharyngitis, unspecified: Secondary | ICD-10-CM

## 2023-11-05 DIAGNOSIS — R059 Cough, unspecified: Secondary | ICD-10-CM

## 2023-11-05 DIAGNOSIS — J301 Allergic rhinitis due to pollen: Secondary | ICD-10-CM | POA: Diagnosis not present

## 2023-11-05 LAB — POCT INFLUENZA A/B
Influenza A, POC: NEGATIVE
Influenza B, POC: NEGATIVE

## 2023-11-05 LAB — POC COVID19 BINAXNOW: SARS Coronavirus 2 Ag: NEGATIVE

## 2023-11-05 LAB — POCT RAPID STREP A (OFFICE): Rapid Strep A Screen: POSITIVE — AB

## 2023-11-05 MED ORDER — AZITHROMYCIN 250 MG PO TABS: ORAL_TABLET | ORAL | 0 refills | Status: AC

## 2023-11-05 MED ORDER — BENZONATATE 100 MG PO CAPS: 100.0000 mg | ORAL_CAPSULE | Freq: Three times a day (TID) | ORAL | 0 refills | Status: DC | PRN

## 2023-11-05 NOTE — Progress Notes (Signed)
 Subjective:    Patient ID: Shelia Delgado, female    DOB: January 11, 1986, 38 y.o.   MRN: [id number]  HPI   Discussed the use of AI scribe software for clinical note transcription with the patient, who gave verbal consent to proceed.  History of Present Illness   Shelia Delgado "Shelia Delgado" is a 38 year old female who presents with a sore throat and cough.  She has been experiencing a sore throat since Sunday night, which initially started as a scratchy sensation persisting for the last couple of weeks. The sore throat has now progressed to the point where it is painful to swallow. No fever, chills, sweats, or body aches are present.  In the last 24 hours, she has developed a cough with sputum production and some chest congestion. Cough developed after nasal congestion She attributes some of her symptoms to allergies, for which she is currently taking Zyrtec.  Her daughter's teacher had strep throat last week, but her daughter, who is two and a [age] years old, has not shown any symptoms. She is allergic to amoxicillin and has previously taken azithromycin in October.  Her last menstrual cycle started on October 14, 2023.          Review of Systems  Constitutional:  Negative for chills, fatigue and fever.  HENT:  Positive for congestion.   Respiratory:  Positive for cough. Negative for choking, shortness of breath and wheezing.   Cardiovascular:  Negative for chest pain and palpitations.  Gastrointestinal:  Negative for abdominal pain.  Musculoskeletal:  Negative for back pain, neck pain and neck stiffness.  Neurological:  Negative for dizziness and light-headedness.  Hematological:  Negative for adenopathy. Does not bruise/bleed easily.  Psychiatric/Behavioral:  Negative for behavioral problems and confusion.      Past Medical History:  Diagnosis Date   Allergy    COVID-19 2020   no vaccine   Dyshidrotic dermatitis    Overweight      Social History   Socioeconomic History    Marital status: Married    Spouse name: Not on file   Number of children: Not on file   Years of education: Not on file   Highest education level: Not on file  Occupational History   Not on file  Tobacco Use   Smoking status: Never    Passive exposure: Never   Smokeless tobacco: Never  Vaping Use   Vaping status: Never Used  Substance and Sexual Activity   Alcohol use: Yes    Comment: occ   Drug use: Not Currently   Sexual activity: Yes    Birth control/protection: Pill  Other Topics Concern   Not on file  Social History Narrative   Not on file   Social Drivers of Health   Financial Resource Strain: Not on file  Food Insecurity: Not on file  Transportation Needs: Not on file  Physical Activity: Not on file  Stress: Not on file  Social Connections: Not on file  Intimate Partner Violence: Not on file    Past Surgical History:  Procedure Laterality Date   WISDOM TOOTH EXTRACTION      Family History  Problem Relation Age of Onset   Hypertension Mother    Skin cancer Mother    Hypertension Father    Atrial fibrillation Father    Diabetes Father    Diabetes Maternal Grandfather    Lung disease Maternal Grandfather    Heart attack Paternal Grandfather    Heart attack Paternal Uncle  Healthy Brother     Allergies  Allergen Reactions   Amoxicillin Rash    Mild rash after taking for 1 week  Mild rash after taking for 1 week, Mild rash after taking for 1 week    Current Outpatient Medications on File Prior to Visit  Medication Sig Dispense Refill   azithromycin (ZITHROMAX) 250 MG tablet Take 2 tab po the first day then take 1 tablet po daily for 4 days 6 tablet 0   cholecalciferol (VITAMIN D3) 25 MCG (1000 UNIT) tablet Take 1,000 Units by mouth daily.     Magnesium Oxide 420 MG TABS Take by mouth.     norethindrone-ethinyl estradiol-FE (LOESTRIN FE) 1-20 MG-MCG tablet Take 1 tablet by mouth daily. 28 tablet 11   promethazine-dextromethorphan  (PROMETHAZINE-DM) 6.25-15 MG/5ML syrup Take 5 mLs by mouth 4 (four) times daily as needed for cough. 118 mL 0   No current facility-administered medications on file prior to visit.    BP 100/60   Pulse 100   Temp 98.5 F (36.9 C)   Resp 18   Ht 5\' 4"  (1.626 m)   Wt 224 lb (101.6 kg)   LMP 10/14/2023   SpO2 99%   BMI 38.45 kg/m        Objective:   Physical Exam  General- No acute distress. Pleasant patient. Neck- Full range of motion, no jvd Lungs- Clear, even and unlabored. Heart- regular rate and rhythm. Neurologic- CNII- XII grossly intact.   Heent- moderate tonsil hypertrophy with redness. Mild subamandibualr node enlarge. No exuduate. Tonsils symmetric. Sounds nasal congested. No sinus pressure. Canals clear and normal tms.      Assessment & Plan:   Assessment and Plan    Streptococcal Pharyngitis Positive rapid strep test. Azithromycin chosen due to amoxicillin allergy. Expected some incremental improvement within 24 hours. Considered non-infectious 24 hours post-antibiotic initiation. - Prescribe azithromycin.(allergy to pcn) - Provide work note for absence until November 07, 2023.  Allergic Rhinitis Chronic mild pharyngitis and cough likely due to allergies. Current cetirizine use. Fluticasone recommended for enhanced management. - Continue cetirizine. - Add fluticasone. - Prescribe benzonatate for cough   Follow up 7 days or sooner if needed. Keep regular follow up with pcp.        Shelia Nazzaro, PA-C

## 2023-11-05 NOTE — Patient Instructions (Signed)
 Streptococcal Pharyngitis Positive rapid strep test. Azithromycin chosen due to amoxicillin allergy. Expected some incremental improvement within 24 hours. Considered non-infectious 24 hours post-antibiotic initiation. - Prescribe azithromycin.(allergy to pcn) - Provide work note for absence until November 07, 2023.  Allergic Rhinitis Chronic mild pharyngitis and cough likely due to allergies. Current cetirizine use. Fluticasone recommended for enhanced management. - Continue cetirizine. - Add fluticasone. - Prescribe benzonatate for cough   Follow up 7 days or sooner if needed. Keep regular follow up with pcp.

## 2023-11-26 ENCOUNTER — Ambulatory Visit: Admitting: Family

## 2023-11-26 VITALS — BP 112/78 | HR 88 | Ht 64.0 in | Wt 221.4 lb

## 2023-11-26 DIAGNOSIS — R197 Diarrhea, unspecified: Secondary | ICD-10-CM

## 2023-11-26 LAB — COMPREHENSIVE METABOLIC PANEL WITH GFR
ALT: 24 U/L (ref 0–35)
AST: 17 U/L (ref 0–37)
Albumin: 4.2 g/dL (ref 3.5–5.2)
Alkaline Phosphatase: 104 U/L (ref 39–117)
BUN: 14 mg/dL (ref 6–23)
CO2: 28 meq/L (ref 19–32)
Calcium: 9.3 mg/dL (ref 8.4–10.5)
Chloride: 101 meq/L (ref 96–112)
Creatinine, Ser: 0.74 mg/dL (ref 0.40–1.20)
GFR: 103.92 mL/min (ref >60.00)
Glucose, Bld: 84 mg/dL (ref 70–99)
Potassium: 4.4 meq/L (ref 3.5–5.1)
Sodium: 136 meq/L (ref 135–145)
Total Bilirubin: 0.4 mg/dL (ref 0.2–1.2)
Total Protein: 7 g/dL (ref 6.0–8.3)

## 2023-11-26 LAB — AMYLASE: Amylase: 36 U/L (ref 27–131)

## 2023-11-26 LAB — CBC WITH DIFFERENTIAL/PLATELET
Basophils Absolute: 0.1 10*3/uL (ref 0.0–0.1)
Basophils Relative: 0.6 % (ref 0.0–3.0)
Eosinophils Absolute: 0.3 10*3/uL (ref 0.0–0.7)
Eosinophils Relative: 2.4 % (ref 0.0–5.0)
HCT: 40.2 % (ref 36.0–46.0)
Hemoglobin: 13.2 g/dL (ref 12.0–15.0)
Lymphocytes Relative: 20.3 % (ref 12.0–46.0)
Lymphs Abs: 2.5 10*3/uL (ref 0.7–4.0)
MCHC: 32.9 g/dL (ref 30.0–36.0)
MCV: 87.3 fl (ref 78.0–100.0)
Monocytes Absolute: 0.9 10*3/uL (ref 0.1–1.0)
Monocytes Relative: 7.7 % (ref 3.0–12.0)
Neutro Abs: 8.4 10*3/uL — ABNORMAL HIGH (ref 1.4–7.7)
Neutrophils Relative %: 69 % (ref 43.0–77.0)
Platelets: 368 10*3/uL (ref 150.0–400.0)
RBC: 4.61 Mil/uL (ref 3.87–5.11)
RDW: 13.4 % (ref 11.5–15.5)
WBC: 12.1 10*3/uL — ABNORMAL HIGH (ref 4.0–10.5)

## 2023-11-26 LAB — LIPASE: Lipase: 24 U/L (ref 11.0–59.0)

## 2023-11-26 NOTE — Patient Instructions (Signed)
 Hold the extra magnesium as we discussed; if symptoms are improving but still present, please cut the pre-natal vitamins back to 2 x per day;  If no relief with the change, go ahead and get the stool culture done.

## 2023-11-26 NOTE — Progress Notes (Signed)
 Shelia Delgado is a 38 y.o. female with the following history as recorded in [employer]:  Patient Active Problem List   Diagnosis Date Noted   Prediabetes 08/26/2023   Metabolic syndrome 08/26/2023   History of gestational hypertension 03/21/2021   BMI 38.0-38.9,adult 01/26/2019   Dyshidrotic dermatitis     Current Outpatient Medications  Medication Sig Dispense Refill   Magnesium Oxide 420 MG TABS Take by mouth.     Prenatal Vit-Fe Fumarate-FA (MULTIVITAMIN-PRENATAL) 27-0.8 MG TABS tablet Take 1 tablet by mouth daily at 12 noon.     No current facility-administered medications for this visit.    Allergies: Amoxicillin  Past Medical History:  Diagnosis Date   Allergy    COVID-19 2020   no vaccine   Dyshidrotic dermatitis    Overweight     Past Surgical History:  Procedure Laterality Date   WISDOM TOOTH EXTRACTION      Family History  Problem Relation Age of Onset   Hypertension Mother    Skin cancer Mother    Hypertension Father    Atrial fibrillation Father    Diabetes Father    Diabetes Maternal Grandfather    Lung disease Maternal Grandfather    Heart attack Paternal Grandfather    Heart attack Paternal Uncle    Healthy Brother     Social History   Tobacco Use   Smoking status: Never    Passive exposure: Never   Smokeless tobacco: Never  Substance Use Topics   Alcohol use: Yes    Comment: occ    Subjective:  Complaining of low grade nausea/ "stomach upset" on and off for the past month; has had some increased diarrhea;  Symptoms started before she took Zithromax  for strep throat in mid-April;  Was taking Moringa leaf powder until about 2 weeks ago; currently taking extra magnesium in addition to pre-natal vitamin; does feel that appetite is down slightly; no recent travel; no blood in stool; no fever, night sweats, unexplained weight loss; no real abdominal pain; describes sensation as feeling her GI tract inflamed/ possible new allergy/ intolerance.      Objective:  Vitals:   11/26/23 1045  BP: 112/78  Pulse: 88  SpO2: 98%  Weight: 221 lb 6.4 oz (100.4 kg)  Height: 5\' 4"  (1.626 m)    General: Well developed, well nourished, in no acute distress  Skin : Warm and dry.  Head: Normocephalic and atraumatic  Eyes: Sclera and conjunctiva clear; pupils round and reactive to light; extraocular movements intact  Ears: External normal; canals clear; tympanic membranes normal  Oropharynx: Pink, supple. No suspicious lesions  Neck: Supple without thyromegaly, adenopathy  Lungs: Respirations unlabored; clear to auscultation bilaterally without wheeze, rales, rhonchi  CVS exam: normal rate and regular rhythm.  Abdomen: Soft; nontender; nondistended; normoactive bowel sounds; no masses or hepatosplenomegaly  Neurologic: Alert and oriented; speech intact; face symmetrical; moves all extremities well; CNII-XII intact without focal deficit   Assessment:  1. Diarrhea, unspecified type     Plan:  ? If reaction to increased dosage of magnesium; she will hold this extra supplement for now- just continue prenatal vitamin; may need to consider cutting prenatal vitamin back to 2 x per day as opposed to 3x per day;  Will update labs today including celiac panel; order for stool culture but hold for now and return if symptoms do not improve with changing supplements; may also need to consider imaging; follow up to be determined.   No follow-ups on file.  Orders Placed  This Encounter  Procedures   Stool Culture   Clostridium difficile culture-fecal   CBC with Differential/Platelet   Comp Met (CMET)   Amylase   Lipase   Celiac Ab tTG DGP TIgA    Requested Prescriptions    No prescriptions requested or ordered in this encounter

## 2023-11-28 LAB — CELIAC AB TTG DGP TIGA
Antigliadin Abs, IgA: 9 U (ref 0–19)
Gliadin IgG: 2 U (ref 0–19)
IgA/Immunoglobulin A, Serum: 256 mg/dL (ref 87–352)
Tissue Transglut Ab: 2 U/mL (ref 0–5)

## 2023-12-09 ENCOUNTER — Telehealth: Payer: Self-pay | Admitting: Family

## 2023-12-10 ENCOUNTER — Other Ambulatory Visit: Payer: Self-pay | Admitting: Family

## 2023-12-10 DIAGNOSIS — D72829 Elevated white blood cell count, unspecified: Secondary | ICD-10-CM

## 2023-12-10 NOTE — Telephone Encounter (Signed)
 Labs have been ordered

## 2023-12-11 ENCOUNTER — Encounter: Payer: Self-pay | Admitting: Family

## 2023-12-13 ENCOUNTER — Other Ambulatory Visit

## 2023-12-17 ENCOUNTER — Ambulatory Visit: Payer: Self-pay | Admitting: Family

## 2023-12-17 ENCOUNTER — Other Ambulatory Visit: Payer: Self-pay | Admitting: Family

## 2023-12-17 DIAGNOSIS — R109 Unspecified abdominal pain: Secondary | ICD-10-CM

## 2023-12-17 LAB — STOOL CULTURE: E coli, Shiga toxin Assay: NEGATIVE

## 2023-12-17 LAB — CLOSTRIDIUM DIFFICILE CULTURE-FECAL

## 2023-12-20 ENCOUNTER — Other Ambulatory Visit: Payer: Self-pay | Admitting: Family

## 2023-12-20 ENCOUNTER — Other Ambulatory Visit (INDEPENDENT_AMBULATORY_CARE_PROVIDER_SITE_OTHER)

## 2023-12-20 ENCOUNTER — Ambulatory Visit: Payer: Self-pay | Admitting: Family

## 2023-12-20 ENCOUNTER — Ambulatory Visit (HOSPITAL_BASED_OUTPATIENT_CLINIC_OR_DEPARTMENT_OTHER): Admission: RE | Admit: 2023-12-20 | Source: Ambulatory Visit

## 2023-12-20 DIAGNOSIS — D72829 Elevated white blood cell count, unspecified: Secondary | ICD-10-CM

## 2023-12-20 DIAGNOSIS — R109 Unspecified abdominal pain: Secondary | ICD-10-CM

## 2023-12-20 LAB — CBC WITH DIFFERENTIAL/PLATELET
Basophils Absolute: 0.1 10*3/uL (ref 0.0–0.1)
Basophils Relative: 0.6 % (ref 0.0–3.0)
Eosinophils Absolute: 0.3 10*3/uL (ref 0.0–0.7)
Eosinophils Relative: 2.9 % (ref 0.0–5.0)
HCT: 37.6 % (ref 36.0–46.0)
Hemoglobin: 12.4 g/dL (ref 12.0–15.0)
Lymphocytes Relative: 23.6 % (ref 12.0–46.0)
Lymphs Abs: 2.7 10*3/uL (ref 0.7–4.0)
MCHC: 32.9 g/dL (ref 30.0–36.0)
MCV: 84.7 fl (ref 78.0–100.0)
Monocytes Absolute: 1 10*3/uL (ref 0.1–1.0)
Monocytes Relative: 8.6 % (ref 3.0–12.0)
Neutro Abs: 7.4 10*3/uL (ref 1.4–7.7)
Neutrophils Relative %: 64.3 % (ref 43.0–77.0)
Platelets: 365 10*3/uL (ref 150.0–400.0)
RBC: 4.43 Mil/uL (ref 3.87–5.11)
RDW: 13.4 % (ref 11.5–15.5)
WBC: 11.5 10*3/uL — ABNORMAL HIGH (ref 4.0–10.5)

## 2023-12-22 ENCOUNTER — Ambulatory Visit (HOSPITAL_BASED_OUTPATIENT_CLINIC_OR_DEPARTMENT_OTHER)
Admission: RE | Admit: 2023-12-22 | Discharge: 2023-12-22 | Disposition: A | Discharge status: Home / Self Care | Source: Ambulatory Visit | Attending: Family | Admitting: Family

## 2023-12-22 DIAGNOSIS — R109 Unspecified abdominal pain: Secondary | ICD-10-CM | POA: Diagnosis present

## 2023-12-23 ENCOUNTER — Ambulatory Visit: Payer: Self-pay | Admitting: Family

## 2023-12-27 ENCOUNTER — Telehealth: Payer: Self-pay | Admitting: Family

## 2023-12-27 NOTE — Telephone Encounter (Signed)
 Copied from CRM [id number]. Topic: Referral - Status >> Dec 27, 2023 11:13 AM Deaijah H wrote: Reason for CRM: Patient called in to advise she has not heard from the [facility] doctor. Please call [phone]

## 2024-01-01 ENCOUNTER — Encounter: Payer: Self-pay | Admitting: Gastroenterology

## 2024-01-27 ENCOUNTER — Encounter: Payer: Self-pay | Admitting: Family

## 2024-02-05 ENCOUNTER — Ambulatory Visit: Admitting: Gastroenterology

## 2024-08-25 ENCOUNTER — Encounter: Payer: Self-pay | Admitting: Family Medicine

## 2024-08-25 ENCOUNTER — Ambulatory Visit: Admitting: Family Medicine

## 2024-08-25 VITALS — BP 120/78 | HR 100 | Temp 98.0°F | Resp 16 | Ht 64.0 in | Wt 224.0 lb

## 2024-08-25 DIAGNOSIS — Z3A01 Less than 8 weeks gestation of pregnancy: Secondary | ICD-10-CM

## 2024-08-25 MED ORDER — ONDANSETRON 4 MG PO TBDP: 4.0000 mg | ORAL_TABLET | Freq: Three times a day (TID) | ORAL | 0 refills | Status: AC | PRN

## 2024-08-25 NOTE — Patient Instructions (Signed)
 Continue taking a prenatal vitamin.  B6 and Unisom can be used for nausea safely.  Let us  know if you need anything.

## 2024-08-27 ENCOUNTER — Telehealth: Payer: Self-pay | Admitting: *Deleted

## 2024-08-27 NOTE — Telephone Encounter (Signed)
 Returned call from 1:48 PM. Left patient a message to call and schedule.

## 2024-09-14 ENCOUNTER — Encounter

## 2024-10-01 ENCOUNTER — Encounter: Admitting: Obstetrics and Gynecology

## 2025-02-09 ENCOUNTER — Encounter: Admitting: Family Medicine
